# Patient Record
Sex: Female | Born: 1955 | Hispanic: No | Smoking: Former smoker
Health system: Southern US, Community
[De-identification: ages and names within clinical notes are randomized; demographics above are authoritative.]

---

## 1998-12-07 ENCOUNTER — Other Ambulatory Visit: Admission: RE | Admit: 1998-12-07 | Discharge: 1998-12-07 | Payer: Self-pay | Admitting: Obstetrics and Gynecology

## 2014-10-13 ENCOUNTER — Ambulatory Visit (INDEPENDENT_AMBULATORY_CARE_PROVIDER_SITE_OTHER): Payer: No Typology Code available for payment source | Admitting: Emergency Medicine

## 2014-10-13 VITALS — BP 120/80 | HR 72 | Temp 98.2°F | Resp 18 | Ht 62.0 in | Wt 148.4 lb

## 2014-10-13 DIAGNOSIS — R5383 Other fatigue: Secondary | ICD-10-CM

## 2014-10-13 DIAGNOSIS — R319 Hematuria, unspecified: Secondary | ICD-10-CM

## 2014-10-13 LAB — POCT UA - MICROSCOPIC ONLY
Bacteria, U Microscopic: NEGATIVE
CASTS, UR, LPF, POC: NEGATIVE
Crystals, Ur, HPF, POC: NEGATIVE
Mucus, UA: POSITIVE
WBC, Ur, HPF, POC: NEGATIVE
YEAST UA: NEGATIVE

## 2014-10-13 LAB — POCT URINALYSIS DIPSTICK
BILIRUBIN UA: NEGATIVE
Glucose, UA: NEGATIVE
KETONES UA: NEGATIVE
Leukocytes, UA: NEGATIVE
NITRITE UA: NEGATIVE
Protein, UA: NEGATIVE
Spec Grav, UA: 1.02
Urobilinogen, UA: 0.2
pH, UA: 5.5

## 2014-10-13 LAB — GLUCOSE, POCT (MANUAL RESULT ENTRY): POC Glucose: 7.7 mg/dl — AB (ref 70–99)

## 2014-10-13 NOTE — Patient Instructions (Signed)

## 2014-10-13 NOTE — Progress Notes (Signed)
Subjective:  Patient ID: Kristina Jacobson, female    DOB: June 24, 1955  Age: 59 y.o. MRN: [id number]  CC: Dizziness and Fatigue   HPI Kristina Jacobson presents  with a recent onset of fatigue. She said that over the last for 5 day she's had increasing fatigue. She has no cough cold right now sore throat fever chills nausea vomiting stool change no wheezing or shortness of breath. She is never had a colonoscopy hasn't been a doctor in at least 6 years has not had a mammogram or Pap smear. She  had no lab work in at least 6 years. She is working full time as a Veterinary surgeon working part-time in a [employer] and taking classes to get her bachelor's degree in [place]. She hopes to teach.  History Thea has no past medical history on file.   She has past surgical history that includes Cesarean section.   Her  family history includes Heart disease in her father; Hypertension in her brother and mother.  She   reports that she has quit smoking. She does not have any smokeless tobacco history on file. She reports that she does not drink alcohol or use illicit drugs.  No outpatient prescriptions prior to visit.   No facility-administered medications prior to visit.    History   Social History  . Marital Status: Unknown    Spouse Name: N/A  . Number of Children: N/A  . Years of Education: N/A   Social History Main Topics  . Smoking status: Former Games developer  . Smokeless tobacco: Not on file  . Alcohol Use: No  . Drug Use: No  . Sexual Activity: Not on file   Other Topics Concern  . None   Social History Narrative  . None     Review of Systems  Constitutional: Positive for fatigue. Negative for fever, chills and appetite change.  HENT: Negative for congestion, ear pain, postnasal drip, sinus pressure and sore throat.   Eyes: Negative for pain and redness.  Respiratory: Negative for cough, shortness of breath and wheezing.   Cardiovascular: Negative for leg swelling.    Gastrointestinal: Negative for nausea, vomiting, abdominal pain, diarrhea, constipation and blood in stool.  Endocrine: Negative for polyuria.  Genitourinary: Negative for dysuria, urgency, frequency and flank pain.  Musculoskeletal: Negative for gait problem.  Skin: Negative for rash.  Neurological: Negative for weakness and headaches.  Psychiatric/Behavioral: Negative for confusion and decreased concentration. The patient is not nervous/anxious.     Objective:  BP 120/80 mmHg  Pulse 72  Temp(Src) 98.2 F (36.8 C) (Oral)  Resp 18  Ht 5\' 2"  (1.575 m)  Wt 148 lb 6 oz (67.302 kg)  BMI 27.13 kg/m2  SpO2 98%  Physical Exam  Constitutional: She is oriented to person, place, and time. She appears well-developed and well-nourished. No distress.  HENT:  Head: Normocephalic and atraumatic.  Right Ear: External ear normal.  Left Ear: External ear normal.  Nose: Nose normal.  Eyes: Conjunctivae and EOM are normal. Pupils are equal, round, and reactive to light. No scleral icterus.  Neck: Normal range of motion. Neck supple. No tracheal deviation present.  Cardiovascular: Normal rate, regular rhythm and normal heart sounds.   Pulmonary/Chest: Effort normal. No respiratory distress. She has no wheezes. She has no rales.  Abdominal: She exhibits no mass. There is no tenderness. There is no rebound and no guarding.  Musculoskeletal: She exhibits no edema.  Lymphadenopathy:    She has no cervical adenopathy.  Neurological: She is alert and oriented to person, place, and time. Coordination normal.  Skin: Skin is warm and dry. No rash noted.  Psychiatric: She has a normal mood and affect. Her behavior is normal.      Assessment & Plan:   There are no diagnoses linked to this encounter. Ms. Knack does not currently have medications on file.  No orders of the defined types were placed in this encounter.   She was found to have hematuria on her urinalysis blood sugar is normal labs are  pending. She was referred for mammogram and colonoscopy. She'll follow-up in a month and will a revisit the hematuria and should that be persistent get her in to see her neurologist.   Appropriate red flag conditions were discussed with the patient as well as actions that should be taken.  Patient expressed his understanding.  Follow-up: No Follow-up on file.  Carmelina Dane, MD   Results for orders placed or performed in visit on 10/13/14  POCT UA - Microscopic Only  Result Value Ref Range   WBC, Ur, HPF, POC neg    RBC, urine, microscopic 0-2    Bacteria, U Microscopic neg    Mucus, UA positive    Epithelial cells, urine per micros 0-1    Crystals, Ur, HPF, POC neg    Casts, Ur, LPF, POC neg    Yeast, UA neg   POCT urinalysis dipstick  Result Value Ref Range   Color, UA yellow    Clarity, UA clear    Glucose, UA neg    Bilirubin, UA neg    Ketones, UA neg    Spec Grav, UA 1.020    Blood, UA moderate    pH, UA 5.5    Protein, UA neg    Urobilinogen, UA 0.2    Nitrite, UA neg    Leukocytes, UA Negative Negative  POCT glucose (manual entry)  Result Value Ref Range   POC Glucose 7.7 (A) 70 - 99 mg/dl

## 2014-10-14 LAB — COMPREHENSIVE METABOLIC PANEL
ALT: 13 U/L (ref 0–35)
AST: 17 U/L (ref 0–37)
Albumin: 4.2 g/dL (ref 3.5–5.2)
Alkaline Phosphatase: 64 U/L (ref 39–117)
BILIRUBIN TOTAL: 0.8 mg/dL (ref 0.2–1.2)
BUN: 21 mg/dL (ref 6–23)
CALCIUM: 9.4 mg/dL (ref 8.4–10.5)
CO2: 29 mEq/L (ref 19–32)
Chloride: 101 mEq/L (ref 96–112)
Creat: 1.07 mg/dL (ref 0.50–1.10)
Glucose, Bld: 83 mg/dL (ref 70–99)
Potassium: 3.9 mEq/L (ref 3.5–5.3)
SODIUM: 141 meq/L (ref 135–145)
Total Protein: 7.8 g/dL (ref 6.0–8.3)

## 2014-10-14 LAB — TSH: TSH: 2.746 u[IU]/mL (ref 0.350–4.500)

## 2014-10-14 LAB — CBC WITH DIFFERENTIAL/PLATELET
BASOS ABS: 0.1 10*3/uL (ref 0.0–0.1)
BASOS PCT: 1 % (ref 0–1)
Eosinophils Absolute: 0.2 10*3/uL (ref 0.0–0.7)
Eosinophils Relative: 3 % (ref 0–5)
HEMATOCRIT: 38.5 % (ref 36.0–46.0)
Hemoglobin: 13.3 g/dL (ref 12.0–15.0)
LYMPHS PCT: 40 % (ref 12–46)
Lymphs Abs: 2.5 10*3/uL (ref 0.7–4.0)
MCH: 28.1 pg (ref 26.0–34.0)
MCHC: 34.5 g/dL (ref 30.0–36.0)
MCV: 81.4 fL (ref 78.0–100.0)
MPV: 9.9 fL (ref 8.6–12.4)
Monocytes Absolute: 0.4 10*3/uL (ref 0.1–1.0)
Monocytes Relative: 7 % (ref 3–12)
NEUTROS ABS: 3 10*3/uL (ref 1.7–7.7)
NEUTROS PCT: 49 % (ref 43–77)
Platelets: 277 10*3/uL (ref 150–400)
RBC: 4.73 MIL/uL (ref 3.87–5.11)
RDW: 13.3 % (ref 11.5–15.5)
WBC: 6.2 10*3/uL (ref 4.0–10.5)

## 2014-10-20 ENCOUNTER — Other Ambulatory Visit: Payer: Self-pay

## 2014-10-20 DIAGNOSIS — Z1231 Encounter for screening mammogram for malignant neoplasm of breast: Secondary | ICD-10-CM

## 2015-06-06 ENCOUNTER — Ambulatory Visit (INDEPENDENT_AMBULATORY_CARE_PROVIDER_SITE_OTHER): Payer: BLUE CROSS/BLUE SHIELD | Admitting: Family Medicine

## 2015-06-06 VITALS — BP 143/84 | HR 76 | Temp 99.0°F | Resp 17 | Ht 69.29 in | Wt 152.0 lb

## 2015-06-06 DIAGNOSIS — Z23 Encounter for immunization: Secondary | ICD-10-CM | POA: Diagnosis not present

## 2015-06-06 NOTE — Patient Instructions (Signed)
You got your tetanus shot (tdap) today and an MMR immunity test.  I will be in touch with your lab results asap- if you want you can set up your mychart account and print these at home

## 2015-06-06 NOTE — Progress Notes (Addendum)
[facility] [address] [place] [address] [phone]  Date:  06/06/2015   Name:  Kristina Jacobson   DOB:  Aug 31, 1955   MRN:  [id number]  PCP:  No PCP Per Patient    Chief Complaint: Immunizations   History of Present Illness:  Kristina Jacobson is a 60 y.o. very pleasant female patient who presents with the following:  She needs some shots for school She needs a Tdap and proof of MMR immunity She is generally in good health NKDA She is an [place] major at [employer] and needs these immunizations to finish her degree  There are no active problems to display for this patient.   No past medical history on file.  Past Surgical History  Procedure Laterality Date  . Cesarean section      Social History  Substance Use Topics  . Smoking status: Former Research scientist (life sciences)  . Smokeless tobacco: None  . Alcohol Use: No    Family History  Problem Relation Age of Onset  . Hypertension Mother   . Heart disease Father   . Hypertension Brother     No Known Allergies  Medication list has been reviewed and updated.  No current outpatient prescriptions on file prior to visit.   No current facility-administered medications on file prior to visit.    Review of Systems:  As per HPI- otherwise negative.   Physical Examination: Filed Vitals:   06/06/15 1408  BP: 143/84  Pulse: 76  Temp: 99 F (37.2 C)  Resp: 17   Filed Vitals:   06/06/15 1408  Height: 5' 9.29" (1.76 m)  Weight: 152 lb (68.947 kg)   Body mass index is 22.26 kg/(m^2). Ideal Body Weight: Weight in (lb) to have BMI = 25: 170.4  GEN: WDWN, NAD, Non-toxic, A & O x 3 HEENT: Atraumatic, Normocephalic. Neck supple. No masses, No LAD. Ears and Nose: No external deformity. CV: RRR, No M/G/R. No JVD. No thrill. No extra heart sounds. PULM: CTA B, no wheezes, crackles, rhonchi. No retractions. No resp. distress. No accessory muscle use. ABD: S, NT, ND, +BS. No rebound. No HSM. EXTR:  No c/c/e NEURO Normal gait.  PSYCH: Normally interactive. Conversant. Not depressed or anxious appearing.  Calm demeanor.    Assessment and Plan: Immunization due - Plan: Tdap vaccine greater than or equal to 7yo IM, Measles/Mumps/Rubella Immunity  tdap and MMR titer today for school  Signed Lamar Blinks, MD  Received her titers 2/15-  Results for orders placed or performed in visit on 06/06/15  Measles/Mumps/Rubella Immunity  Result Value Ref Range   Rubella 5.10 (H) <0.90 Index   Mumps IgG 9.89 (H) <9.00 AU/mL   Rubeola IgG >300.00 (H) <25.00 AU/mL   Immune to rubella and rubeola but not to mumps. She will need to come in for an MMR.  I called but the number listed does not work and neither does the emergency contact number I will have to send her a letter- please give her an MMR when she comes in for follow-up

## 2015-06-07 LAB — MEASLES/MUMPS/RUBELLA IMMUNITY
MUMPS IGG: 9.89 [AU]/ml — AB (ref <9.00)
Rubella: 5.1 Index — ABNORMAL HIGH (ref <0.90)
Rubeola IgG: >300 AU/mL — ABNORMAL HIGH (ref <25.00)

## 2015-06-08 ENCOUNTER — Encounter: Payer: Self-pay | Admitting: Family Medicine

## 2015-06-20 ENCOUNTER — Ambulatory Visit (INDEPENDENT_AMBULATORY_CARE_PROVIDER_SITE_OTHER): Payer: BLUE CROSS/BLUE SHIELD | Admitting: *Deleted

## 2015-06-20 DIAGNOSIS — Z23 Encounter for immunization: Secondary | ICD-10-CM | POA: Diagnosis not present

## 2015-06-28 ENCOUNTER — Emergency Department (HOSPITAL_COMMUNITY)
Admission: EM | Admit: 2015-06-28 | Discharge: 2015-06-28 | Disposition: A | Discharge status: Home / Self Care | Payer: BLUE CROSS/BLUE SHIELD | Source: Home / Self Care | Attending: Family Medicine | Admitting: Family Medicine

## 2015-06-28 ENCOUNTER — Encounter (HOSPITAL_COMMUNITY): Payer: Self-pay | Admitting: *Deleted

## 2015-06-28 DIAGNOSIS — S39012A Strain of muscle, fascia and tendon of lower back, initial encounter: Secondary | ICD-10-CM | POA: Diagnosis not present

## 2015-06-28 MED ORDER — KETOROLAC TROMETHAMINE 30 MG/ML IJ SOLN
30.0000 mg | Freq: Once | INTRAMUSCULAR | Status: AC
  Administered 2015-06-28: 30 mg via INTRAMUSCULAR

## 2015-06-28 MED ORDER — KETOROLAC TROMETHAMINE 30 MG/ML IJ SOLN
INTRAMUSCULAR | Status: AC
  Filled 2015-06-28: qty 1

## 2015-06-28 MED ORDER — CYCLOBENZAPRINE HCL 5 MG PO TABS: 5.0000 mg | ORAL_TABLET | Freq: Three times a day (TID) | ORAL | Status: DC | PRN

## 2015-06-28 MED ORDER — IBUPROFEN 800 MG PO TABS: 800.0000 mg | ORAL_TABLET | Freq: Three times a day (TID) | ORAL | Status: DC

## 2015-06-28 NOTE — ED Provider Notes (Signed)
CSN: [id number]     Arrival date & time 06/28/15  1825 History   First MD Initiated Contact with Patient 06/28/15 2016     Chief Complaint  Patient presents with  . Back Pain   (Consider location/radiation/quality/duration/timing/severity/associated sxs/prior Treatment) Patient is a 60 y.o. female presenting with back pain. The history is provided by the patient.  Back Pain Location:  Gluteal region and sacro-iliac joint Quality:  Stabbing and stiffness Radiates to:  L posterior upper leg Pain severity:  Mild Onset quality:  Gradual Duration:  4 days Progression:  Unchanged Chronicity:  New Context: lifting heavy objects   Context comment:  Onset after picked up heavy case of water at home, no neuro ,gi, or gu sx. Relieved by:  None tried Worsened by:  Nothing tried Ineffective treatments:  None tried Associated symptoms: no abdominal pain, no bladder incontinence, no bowel incontinence, no chest pain, no dysuria, no fever, no leg pain, no numbness, no paresthesias, no pelvic pain, no perianal numbness and no weakness     History reviewed. No pertinent past medical history. Past Surgical History  Procedure Laterality Date  . Cesarean section     Family History  Problem Relation Age of Onset  . Hypertension Mother   . Heart disease Father   . Hypertension Brother    Social History  Substance Use Topics  . Smoking status: Former Games developer  . Smokeless tobacco: None  . Alcohol Use: No   OB History    No data available     Review of Systems  Constitutional: Negative.  Negative for fever.  Cardiovascular: Negative for chest pain.  Gastrointestinal: Negative.  Negative for abdominal pain and bowel incontinence.  Genitourinary: Negative.  Negative for bladder incontinence, dysuria and pelvic pain.  Musculoskeletal: Positive for back pain. Negative for myalgias, joint swelling and gait problem.  Neurological: Negative.  Negative for weakness, numbness and paresthesias.  All  other systems reviewed and are negative.   Allergies  Review of patient's allergies indicates no known allergies.  Home Medications   Prior to Admission medications   Medication Sig Start Date End Date Taking? Authorizing Provider  cyclobenzaprine (AMRIX) 30 MG 24 hr capsule Take 1 capsule (30 mg total) by mouth at bedtime. 07/04/15   Sherren Mocha, MD  meloxicam (MOBIC) 15 MG tablet Take 1 tablet (15 mg total) by mouth daily. Start after prednisone is complete 07/04/15   Sherren Mocha, MD  polyethylene glycol powder (GLYCOLAX/MIRALAX) powder Take 17 g by mouth daily. 07/04/15   Sherren Mocha, MD  predniSONE (DELTASONE) 20 MG tablet Take 3 tabs po qd x 2d, 2 tabs po qd x 2d, 1 tab po qd x 2d 07/04/15   Sherren Mocha, MD   Meds Ordered and Administered this Visit   Medications  ketorolac (TORADOL) 30 MG/ML injection 30 mg (30 mg Intramuscular Given 06/28/15 2053)    BP 130/70 mmHg  Pulse 78  Temp(Src) 98.6 F (37 C) (Oral)  Resp 18  SpO2 100% No data found.   Physical Exam  Constitutional: She is oriented to person, place, and time. She appears well-developed and well-nourished. No distress.  Musculoskeletal: She exhibits tenderness.       Lumbar back: She exhibits tenderness and pain. She exhibits no swelling, no laceration, no spasm and normal pulse.       Back:  Neurological: She is alert and oriented to person, place, and time.  Nursing note and vitals reviewed.   ED Course  Procedures (including critical care time)  Labs Review Labs Reviewed - No data to display  Imaging Review No results found.   Visual Acuity Review  Right Eye Distance:   Left Eye Distance:   Bilateral Distance:    Right Eye Near:   Left Eye Near:    Bilateral Near:         MDM   1. Low back strain, initial encounter        Linna HoffJames D Anay Walter, MD 07/08/15 2124

## 2015-06-28 NOTE — ED Notes (Signed)
Pt  Reports  l  Sided    Back  Pain        Worse  On  Movement   And  Position   Pt  Reports    Lifted   Object  On  Sunday  When  Pain  Started      Pt  denys  Any  Urinary  Symptoms

## 2015-06-28 NOTE — Discharge Instructions (Signed)
Heat to back and medicine and execises as given and see orthpedist if further problems.

## 2015-07-04 ENCOUNTER — Ambulatory Visit (INDEPENDENT_AMBULATORY_CARE_PROVIDER_SITE_OTHER): Payer: BLUE CROSS/BLUE SHIELD | Admitting: Family Medicine

## 2015-07-04 ENCOUNTER — Ambulatory Visit (INDEPENDENT_AMBULATORY_CARE_PROVIDER_SITE_OTHER): Payer: BLUE CROSS/BLUE SHIELD

## 2015-07-04 VITALS — BP 118/88 | HR 89 | Temp 97.7°F | Resp 16 | Ht 61.75 in | Wt 150.0 lb

## 2015-07-04 DIAGNOSIS — M5442 Lumbago with sciatica, left side: Secondary | ICD-10-CM

## 2015-07-04 MED ORDER — CYCLOBENZAPRINE HCL ER 30 MG PO CP24: 30.0000 mg | ORAL_CAPSULE | Freq: Every day | ORAL | Status: DC

## 2015-07-04 MED ORDER — PREDNISONE 20 MG PO TABS: ORAL_TABLET | ORAL | Status: DC

## 2015-07-04 MED ORDER — POLYETHYLENE GLYCOL 3350 17 GM/SCOOP PO POWD: 17.0000 g | Freq: Every day | ORAL | Status: DC

## 2015-07-04 MED ORDER — MELOXICAM 15 MG PO TABS: 15.0000 mg | ORAL_TABLET | Freq: Every day | ORAL | Status: DC

## 2015-07-04 NOTE — Patient Instructions (Addendum)
After the 6 day prednisone course is complete, then start the meloxicam daily. Do not use any other otc pain medication other than tylenol/acetaminophen - so no aleve, ibuprofen, motrin, advil, etc while you are on the meloxicam.  Sciatica With Rehab The sciatic nerve runs from the back down the leg and is responsible for sensation and control of the muscles in the back (posterior) side of the thigh, lower leg, and foot. Sciatica is a condition that is characterized by inflammation of this nerve.  SYMPTOMS   Signs of nerve damage, including numbness and/or weakness along the posterior side of the lower extremity.  Pain in the back of the thigh that may also travel down the leg.  Pain that worsens when sitting for long periods of time.  Occasionally, pain in the back or buttock. CAUSES  Inflammation of the sciatic nerve is the cause of sciatica. The inflammation is due to something irritating the nerve. Common sources of irritation include:  Sitting for long periods of time.  Direct trauma to the nerve.  Arthritis of the spine.  Herniated or ruptured disk.  Slipping of the vertebrae (spondylolisthesis).  Pressure from soft tissues, such as muscles or ligament-like tissue (fascia). RISK INCREASES WITH:  Sports that place pressure or stress on the spine (football or weightlifting).  Poor strength and flexibility.  Failure to warm up properly before activity.  Family history of low back pain or disk disorders.  Previous back injury or surgery.  Poor body mechanics, especially when lifting, or poor posture. PREVENTION   Warm up and stretch properly before activity.  Maintain physical fitness:  Strength, flexibility, and endurance.  Cardiovascular fitness.  Learn and use proper technique, especially with posture and lifting. When possible, have coach correct improper technique.  Avoid activities that place stress on the spine. PROGNOSIS If treated properly, then  sciatica usually resolves within 6 weeks. However, occasionally surgery is necessary.  RELATED COMPLICATIONS   Permanent nerve damage, including pain, numbness, tingle, or weakness.  Chronic back pain.  Risks of surgery: infection, bleeding, nerve damage, or damage to surrounding tissues. TREATMENT Treatment initially involves resting from any activities that aggravate your symptoms. The use of ice and medication may help reduce pain and inflammation. The use of strengthening and stretching exercises may help reduce pain with activity. These exercises may be performed at home or with referral to a therapist. A therapist may recommend further treatments, such as transcutaneous electronic nerve stimulation (TENS) or ultrasound. Your caregiver may recommend corticosteroid injections to help reduce inflammation of the sciatic nerve. If symptoms persist despite non-surgical (conservative) treatment, then surgery may be recommended. MEDICATION  If pain medication is necessary, then nonsteroidal anti-inflammatory medications, such as aspirin and ibuprofen, or other minor pain relievers, such as acetaminophen, are often recommended.  Do not take pain medication for 7 days before surgery.  Prescription pain relievers may be given if deemed necessary by your caregiver. Use only as directed and only as much as you need.  Ointments applied to the skin may be helpful.  Corticosteroid injections may be given by your caregiver. These injections should be reserved for the most serious cases, because they may only be given a certain number of times. HEAT AND COLD  Cold treatment (icing) relieves pain and reduces inflammation. Cold treatment should be applied for 10 to 15 minutes every 2 to 3 hours for inflammation and pain and immediately after any activity that aggravates your symptoms. Use ice packs or massage the area with a  piece of ice (ice massage).  Heat treatment may be used prior to performing the  stretching and strengthening activities prescribed by your caregiver, physical therapist, or athletic trainer. Use a heat pack or soak the injury in warm water. SEEK MEDICAL CARE IF:  Treatment seems to offer no benefit, or the condition worsens.  Any medications produce adverse side effects. EXERCISES  RANGE OF MOTION (ROM) AND STRETCHING EXERCISES - Sciatica Most people with sciatic will find that their symptoms worsen with either excessive bending forward (flexion) or arching at the low back (extension). The exercises which will help resolve your symptoms will focus on the opposite motion. Your physician, physical therapist or athletic trainer will help you determine which exercises will be most helpful to resolve your low back pain. Do not complete any exercises without first consulting with your clinician. Discontinue any exercises which worsen your symptoms until you speak to your clinician. If you have pain, numbness or tingling which travels down into your buttocks, leg or foot, the goal of the therapy is for these symptoms to move closer to your back and eventually resolve. Occasionally, these leg symptoms will get better, but your low back pain may worsen; this is typically an indication of progress in your rehabilitation. Be certain to be very alert to any changes in your symptoms and the activities in which you participated in the 24 hours prior to the change. Sharing this information with your clinician will allow him/her to most efficiently treat your condition. These exercises may help you when beginning to rehabilitate your injury. Your symptoms may resolve with or without further involvement from your physician, physical therapist or athletic trainer. While completing these exercises, remember:   Restoring tissue flexibility helps normal motion to return to the joints. This allows healthier, less painful movement and activity.  An effective stretch should be held for at least 30  seconds.  A stretch should never be painful. You should only feel a gentle lengthening or release in the stretched tissue. FLEXION RANGE OF MOTION AND STRETCHING EXERCISES: STRETCH - Flexion, Single Knee to Chest   Lie on a firm bed or floor with both legs extended in front of you.  Keeping one leg in contact with the floor, bring your opposite knee to your chest. Hold your leg in place by either grabbing behind your thigh or at your knee.  Pull until you feel a gentle stretch in your low back. Hold __________ seconds.  Slowly release your grasp and repeat the exercise with the opposite side. Repeat __________ times. Complete this exercise __________ times per day.  STRETCH - Flexion, Double Knee to Chest  Lie on a firm bed or floor with both legs extended in front of you.  Keeping one leg in contact with the floor, bring your opposite knee to your chest.  Tense your stomach muscles to support your back and then lift your other knee to your chest. Hold your legs in place by either grabbing behind your thighs or at your knees.  Pull both knees toward your chest until you feel a gentle stretch in your low back. Hold __________ seconds.  Tense your stomach muscles and slowly return one leg at a time to the floor. Repeat __________ times. Complete this exercise __________ times per day.  STRETCH - Low Trunk Rotation   Lie on a firm bed or floor. Keeping your legs in front of you, bend your knees so they are both pointed toward the ceiling and your feet are  flat on the floor.  Extend your arms out to the side. This will stabilize your upper body by keeping your shoulders in contact with the floor.  Gently and slowly drop both knees together to one side until you feel a gentle stretch in your low back. Hold for __________ seconds.  Tense your stomach muscles to support your low back as you bring your knees back to the starting position. Repeat the exercise to the other side. Repeat  __________ times. Complete this exercise __________ times per day  EXTENSION RANGE OF MOTION AND FLEXIBILITY EXERCISES: STRETCH - Extension, Prone on Elbows  Lie on your stomach on the floor, a bed will be too soft. Place your palms about shoulder width apart and at the height of your head.  Place your elbows under your shoulders. If this is too painful, stack pillows under your chest.  Allow your body to relax so that your hips drop lower and make contact more completely with the floor.  Hold this position for __________ seconds.  Slowly return to lying flat on the floor. Repeat __________ times. Complete this exercise __________ times per day.  RANGE OF MOTION - Extension, Prone Press Ups  Lie on your stomach on the floor, a bed will be too soft. Place your palms about shoulder width apart and at the height of your head.  Keeping your back as relaxed as possible, slowly straighten your elbows while keeping your hips on the floor. You may adjust the placement of your hands to maximize your comfort. As you gain motion, your hands will come more underneath your shoulders.  Hold this position __________ seconds.  Slowly return to lying flat on the floor. Repeat __________ times. Complete this exercise __________ times per day.  STRENGTHENING EXERCISES - Sciatica  These exercises may help you when beginning to rehabilitate your injury. These exercises should be done near your "sweet spot." This is the neutral, low-back arch, somewhere between fully rounded and fully arched, that is your least painful position. When performed in this safe range of motion, these exercises can be used for people who have either a flexion or extension based injury. These exercises may resolve your symptoms with or without further involvement from your physician, physical therapist or athletic trainer. While completing these exercises, remember:   Muscles can gain both the endurance and the strength needed for  everyday activities through controlled exercises.  Complete these exercises as instructed by your physician, physical therapist or athletic trainer. Progress with the resistance and repetition exercises only as your caregiver advises.  You may experience muscle soreness or fatigue, but the pain or discomfort you are trying to eliminate should never worsen during these exercises. If this pain does worsen, stop and make certain you are following the directions exactly. If the pain is still present after adjustments, discontinue the exercise until you can discuss the trouble with your clinician. STRENGTHENING - Deep Abdominals, Pelvic Tilt   Lie on a firm bed or floor. Keeping your legs in front of you, bend your knees so they are both pointed toward the ceiling and your feet are flat on the floor.  Tense your lower abdominal muscles to press your low back into the floor. This motion will rotate your pelvis so that your tail bone is scooping upwards rather than pointing at your feet or into the floor.  With a gentle tension and even breathing, hold this position for __________ seconds. Repeat __________ times. Complete this exercise __________ times per day.  STRENGTHENING - Abdominals, Crunches   Lie on a firm bed or floor. Keeping your legs in front of you, bend your knees so they are both pointed toward the ceiling and your feet are flat on the floor. Cross your arms over your chest.  Slightly tip your chin down without bending your neck.  Tense your abdominals and slowly lift your trunk high enough to just clear your shoulder blades. Lifting higher can put excessive stress on the low back and does not further strengthen your abdominal muscles.  Control your return to the starting position. Repeat __________ times. Complete this exercise __________ times per day.  STRENGTHENING - Quadruped, Opposite UE/LE Lift  Assume a hands and knees position on a firm surface. Keep your hands under your  shoulders and your knees under your hips. You may place padding under your knees for comfort.  Find your neutral spine and gently tense your abdominal muscles so that you can maintain this position. Your shoulders and hips should form a rectangle that is parallel with the floor and is not twisted.  Keeping your trunk steady, lift your right hand no higher than your shoulder and then your left leg no higher than your hip. Make sure you are not holding your breath. Hold this position __________ seconds.  Continuing to keep your abdominal muscles tense and your back steady, slowly return to your starting position. Repeat with the opposite arm and leg. Repeat __________ times. Complete this exercise __________ times per day.  STRENGTHENING - Abdominals and Quadriceps, Straight Leg Raise   Lie on a firm bed or floor with both legs extended in front of you.  Keeping one leg in contact with the floor, bend the other knee so that your foot can rest flat on the floor.  Find your neutral spine, and tense your abdominal muscles to maintain your spinal position throughout the exercise.  Slowly lift your straight leg off the floor about 6 inches for a count of 15, making sure to not hold your breath.  Still keeping your neutral spine, slowly lower your leg all the way to the floor. Repeat this exercise with each leg __________ times. Complete this exercise __________ times per day. POSTURE AND BODY MECHANICS CONSIDERATIONS - Sciatica Keeping correct posture when sitting, standing or completing your activities will reduce the stress put on different body tissues, allowing injured tissues a chance to heal and limiting painful experiences. The following are general guidelines for improved posture. Your physician or physical therapist will provide you with any instructions specific to your needs. While reading these guidelines, remember:  The exercises prescribed by your provider will help you have the  flexibility and strength to maintain correct postures.  The correct posture provides the optimal environment for your joints to work. All of your joints have less wear and tear when properly supported by a spine with good posture. This means you will experience a healthier, less painful body.  Correct posture must be practiced with all of your activities, especially prolonged sitting and standing. Correct posture is as important when doing repetitive low-stress activities (typing) as it is when doing a single heavy-load activity (lifting). RESTING POSITIONS Consider which positions are most painful for you when choosing a resting position. If you have pain with flexion-based activities (sitting, bending, stooping, squatting), choose a position that allows you to rest in a less flexed posture. You would want to avoid curling into a fetal position on your side. If your pain worsens with extension-based activities (  prolonged standing, working overhead), avoid resting in an extended position such as sleeping on your stomach. Most people will find more comfort when they rest with their spine in a more neutral position, neither too rounded nor too arched. Lying on a non-sagging bed on your side with a pillow between your knees, or on your back with a pillow under your knees will often provide some relief. Keep in mind, being in any one position for a prolonged period of time, no matter how correct your posture, can still lead to stiffness. PROPER SITTING POSTURE In order to minimize stress and discomfort on your spine, you must sit with correct posture Sitting with good posture should be effortless for a healthy body. Returning to good posture is a gradual process. Many people can work toward this most comfortably by using various supports until they have the flexibility and strength to maintain this posture on their own. When sitting with proper posture, your ears will fall over your shoulders and your shoulders  will fall over your hips. You should use the back of the chair to support your upper back. Your low back will be in a neutral position, just slightly arched. You may place a small pillow or folded towel at the base of your low back for support.  When working at a desk, create an environment that supports good, upright posture. Without extra support, muscles fatigue and lead to excessive strain on joints and other tissues. Keep these recommendations in mind: CHAIR:   A chair should be able to slide under your desk when your back makes contact with the back of the chair. This allows you to work closely.  The chair's height should allow your eyes to be level with the upper part of your monitor and your hands to be slightly lower than your elbows. BODY POSITION  Your feet should make contact with the floor. If this is not possible, use a foot rest.  Keep your ears over your shoulders. This will reduce stress on your neck and low back. INCORRECT SITTING POSTURES   If you are feeling tired and unable to assume a healthy sitting posture, do not slouch or slump. This puts excessive strain on your back tissues, causing more damage and pain. Healthier options include:  Using more support, like a lumbar pillow.  Switching tasks to something that requires you to be upright or walking.  Talking a brief walk.  Lying down to rest in a neutral-spine position. PROLONGED STANDING WHILE SLIGHTLY LEANING FORWARD  When completing a task that requires you to lean forward while standing in one place for a long time, place either foot up on a stationary 2-4 inch high object to help maintain the best posture. When both feet are on the ground, the low back tends to lose its slight inward curve. If this curve flattens (or becomes too large), then the back and your other joints will experience too much stress, fatigue more quickly and can cause pain.  CORRECT STANDING POSTURES Proper standing posture should be assumed  with all daily activities, even if they only take a few moments, like when brushing your teeth. As in sitting, your ears should fall over your shoulders and your shoulders should fall over your hips. You should keep a slight tension in your abdominal muscles to brace your spine. Your tailbone should point down to the ground, not behind your body, resulting in an over-extended swayback posture.  INCORRECT STANDING POSTURES  Common incorrect standing postures include a  forward head, locked knees and/or an excessive swayback. WALKING Walk with an upright posture. Your ears, shoulders and hips should all line-up. PROLONGED ACTIVITY IN A FLEXED POSITION When completing a task that requires you to bend forward at your waist or lean over a low surface, try to find a way to stabilize 3 of 4 of your limbs. You can place a hand or elbow on your thigh or rest a knee on the surface you are reaching across. This will provide you more stability so that your muscles do not fatigue as quickly. By keeping your knees relaxed, or slightly bent, you will also reduce stress across your low back. CORRECT LIFTING TECHNIQUES DO :   Assume a wide stance. This will provide you more stability and the opportunity to get as close as possible to the object which you are lifting.  Tense your abdominals to brace your spine; then bend at the knees and hips. Keeping your back locked in a neutral-spine position, lift using your leg muscles. Lift with your legs, keeping your back straight.  Test the weight of unknown objects before attempting to lift them.  Try to keep your elbows locked down at your sides in order get the best strength from your shoulders when carrying an object.  Always ask for help when lifting heavy or awkward objects. INCORRECT LIFTING TECHNIQUES DO NOT:   Lock your knees when lifting, even if it is a small object.  Bend and twist. Pivot at your feet or move your feet when needing to change  directions.  Assume that you cannot safely pick up a paperclip without proper posture.   This information is not intended to replace advice given to you by your health care provider. Make sure you discuss any questions you have with your health care provider.   Document Released: 04/09/2005 Document Revised: 08/24/2014 Document Reviewed: 07/22/2008 [employer] Interactive Patient Education [employer].

## 2015-07-04 NOTE — Progress Notes (Signed)
Subjective:  By signing my name below, I, [name], attest that this documentation has been prepared under the direction and in the presence of [name], MD.  Electronically Signed: Andrew Au, ED Scribe. 07/04/2015. 3:14 PM.   Patient ID: Kristina Jacobson, female    DOB: November 18, 1955, 60 y.o.   MRN: [id number]  HPI   Chief Complaint  Patient presents with  . Back Pain    low back pain running into left hip and thigh.  was seen at [facility] on 06/28/15   HPI Comments: Kristina Jacobson is a 60 y.o. female who [facility] complaining of left low back pain. She was seen at [facility] it appears she was prescribed flexeril and ibuprofen. No imaging was done. Pt first felt back pain after lifting case of water. She reports gradually worsening pain since injury. Pt rpeorts shooting pain down lateral thigh that stops at her knee first thing in the morning. Due to pain she has weakness in left leg in the morning that gets better throughout the day with movement. She has worsening pain going down stairs than climbing up. She has been taking ibuprofen and flexeril 3 times a day.  She was advised to use a heating pad to low back but finds this worsens pain. She denies hx of back pain. She denies changes in bladder and bowel.  There are no active problems to display for this patient.  No past medical history on file. Past Surgical History  Procedure Laterality Date  . Cesarean section     No Known Allergies Prior to Admission medications   Medication Sig Start Date End Date Taking? Authorizing Provider  cyclobenzaprine (FLEXERIL) 5 MG tablet Take 1 tablet (5 mg total) by mouth 3 (three) times daily as needed for muscle spasms. 06/28/15  Yes Linna Hoff, MD  ibuprofen (ADVIL,MOTRIN) 800 MG tablet Take 1 tablet (800 mg total) by mouth 3 (three) times daily. 06/28/15  Yes Linna Hoff, MD   Social History   Social History  . Marital Status: Unknown    Spouse  Name: N/A  . Number of Children: N/A  . Years of Education: N/A   Occupational History  . Not on file.   Social History Main Topics  . Smoking status: Former Games developer  . Smokeless tobacco: Not on file  . Alcohol Use: No  . Drug Use: No  . Sexual Activity: Not on file   Other Topics Concern  . Not on file   Social History Narrative   Review of Systems  Constitutional: Negative for fever and chills.  Gastrointestinal: Negative for nausea, vomiting, abdominal pain, diarrhea and constipation.  Genitourinary: Negative for urgency, frequency, decreased urine volume and difficulty urinating.  Musculoskeletal: Positive for myalgias, back pain, arthralgias and gait problem. Negative for joint swelling.  Skin: Negative for color change and rash.  Neurological: Positive for weakness. Negative for numbness.  Hematological: Negative for adenopathy. Does not bruise/bleed easily.    Objective:   Physical Exam  Constitutional: She is oriented to person, place, and time. She appears well-developed and well-nourished. No distress.  HENT:  Head: Normocephalic and atraumatic.  Eyes: Conjunctivae and EOM are normal.  Neck: Neck supple.  Cardiovascular: Normal rate.   Pulmonary/Chest: Effort normal.  Musculoskeletal: Normal range of motion.  No point tenderness over lumbar spine or para spinal muscles. Mild tenderness over left greater trochanter. Negative straight leg raise bilaterally. Normal hip ROM bilaterally.  Full lumbar ROM.   Neurological: She is alert and oriented to person, place, and time.  Reflex Scores:      Patellar reflexes are 2+ on the right side and 2+ on the left side.      Achilles reflexes are 2+ on the right side and 2+ on the left side. Skin: Skin is warm and dry.  Psychiatric: She has a normal mood and affect. Her behavior is normal.  Nursing note and vitals reviewed.  Filed Vitals:   07/04/15 1507  BP: 118/88  Pulse: 89  Temp: 97.7 F (36.5 C)  TempSrc: Oral    Resp: 16  Height: 5' 1.75" (1.568 m)  Weight: 150 lb (68.04 kg)  SpO2: 98%   Dg Lumbar Spine Complete  07/04/2015  CLINICAL DATA:  Low back pain.  Twisting injury. EXAM: LUMBAR SPINE - COMPLETE 4+ VIEW COMPARISON:  None. FINDINGS: Five lumbar type vertebral bodies. Sacroiliac joints are symmetric. Maintenance of vertebral body height and alignment. Intervertebral disc heights are maintained. IMPRESSION: No acute osseous abnormality. Electronically Signed   By: Jeronimo GreavesKyle  Talbot M.D.   On: 07/04/2015 16:47     Assessment & Plan:   1. Acute left-sided low back pain with left-sided sciatica     Orders Placed This Encounter  Procedures  . DG Lumbar Spine Complete    Standing Status: Future     Number of Occurrences: 1     Standing Expiration Date: 07/03/2016    Order Specific Question:  Reason for Exam (SYMPTOM  OR DIAGNOSIS REQUIRED)    Answer:  continued lumbar pain with intermittant left sciatica x 1 wk    Order Specific Question:  Is the patient pregnant?    Answer:  No    Order Specific Question:  Preferred imaging location?    Answer:  External  . Ambulatory referral to Physical Therapy    Referral Priority:  Routine    Referral Type:  Physical Medicine    Referral Reason:  Specialty Services Required    Requested Specialty:  Physical Therapy    Number of Visits Requested:  1    Meds ordered this encounter  Medications  . predniSONE (DELTASONE) 20 MG tablet    Sig: Take 3 tabs po qd x 2d, 2 tabs po qd x 2d, 1 tab po qd x 2d    Dispense:  12 tablet    Refill:  0  . cyclobenzaprine (AMRIX) 30 MG 24 hr capsule    Sig: Take 1 capsule (30 mg total) by mouth at bedtime.    Dispense:  30 capsule    Refill:  0  . meloxicam (MOBIC) 15 MG tablet    Sig: Take 1 tablet (15 mg total) by mouth daily. Start after prednisone is complete    Dispense:  30 tablet    Refill:  0  . polyethylene glycol powder (GLYCOLAX/MIRALAX) powder    Sig: Take 17 g by mouth daily.    Dispense:  250 g     Refill:  1    I personally performed the services described in this documentation, which was scribed in my presence. The recorded information has been reviewed and considered, and addended by me as needed.  Norberto SorensonEva Lional Icenogle, MD MPH

## 2015-07-13 ENCOUNTER — Ambulatory Visit: Payer: BLUE CROSS/BLUE SHIELD | Attending: Family Medicine | Admitting: Physical Therapy

## 2015-07-13 DIAGNOSIS — M5442 Lumbago with sciatica, left side: Secondary | ICD-10-CM | POA: Insufficient documentation

## 2015-07-13 NOTE — Therapy (Signed)
[facility] [address] [place], [place], [address] Phone: [phone]   Fax:  [phone]  Physical Therapy Evaluation  Patient Details  Name: Kristina Jacobson Date of Birth: 02/25/1956 Referring Provider: Dr. Zannie KehrEve Shaw  Encounter Date: 07/13/2015      PT End of Session - 07/13/15 1341    Visit Number 1   Number of Visits 4   Date for PT Re-Evaluation 08/10/15   PT Start Time 1300   PT Stop Time 1335   PT Time Calculation (min) 35 min   Activity Tolerance Patient tolerated treatment well   Behavior During Therapy [facility] for tasks assessed/performed      No past medical history on file.  Past Surgical History  Procedure Laterality Date  . Cesarean section      There were no vitals filed for this visit.  Visit Diagnosis:  Left-sided low back pain with left-sided sciatica      Subjective Assessment - 07/13/15 1304    Subjective Patient was lifting heavy object from truck and strained her low back on L side.  Pain was initially radiating down LLE.  She had LLE weakness, was difficult to stand, esp in the AM. She received a miuscle relaxer at Urgent Care.  She reports 80% improvement since the initial injury.  She is accustomed to working, being physically active and going to school.     Limitations Sitting;Lifting   Diagnostic tests XR   Patient Stated Goals Would like to be able to return to PLOF.    Currently in Pain? Yes   Pain Score 1   premedicated   Pain Location Back   Pain Orientation Left;Lower   Pain Descriptors / Indicators Burning   Pain Type Acute pain   Pain Radiating Towards LLE (initially, but has not done in 2 weeks)    Pain Onset More than a month ago   Pain Frequency Intermittent   Aggravating Factors  sitting too long, sudden movement   Pain Relieving Factors meds, stretches             [facility] PT Assessment - 07/13/15 1312    Assessment   Medical Diagnosis low back pain    Referring Provider Dr. Zannie KehrEve Shaw   Onset Date/Surgical Date 06/28/15   Hand Dominance Left   Next MD Visit None yet.    Prior Therapy No    Precautions   Precautions None   Restrictions   Weight Bearing Restrictions No   Balance Screen   Has the patient fallen in the past 6 months No   Prior Function   Level of Independence Independent   Cognition   Overall Cognitive Status Within Functional Limits for tasks assessed   Observation/Other Assessments   Focus on Therapeutic Outcomes (FOTO)  43%   Sensation   Light Touch Appears Intact   Coordination   Gross Motor Movements are Fluid and Coordinated Not tested   Posture/Postural Control   Posture/Postural Control Postural limitations   Posture Comments sway back, Rt. shoulder low    AROM   Lumbar Flexion WNL   Lumbar Extension WNL   Lumbar - Right Side Bend pain WNL   Lumbar - Left Side Bend WNL   Lumbar - Right Rotation WNL   Lumbar - Left Rotation pain WNL   Strength   Strength Assessment Site --  LE WNL in sitting    Palpation   Palpation comment min pain just lateral to SIJ some soreness into  lateral hip              PT Education - 07/13/15 1340    Education provided Yes   Education Details PT/POC, HEP and correction of posture    Person(s) Educated Patient   Methods Explanation;Demonstration;Handout   Comprehension Verbalized understanding;Returned demonstration;Need further instruction             PT Long Term Goals - 07/13/15 1409    PT LONG TERM GOAL #1   Title Patient will be I with HEP    Time 4   Period Weeks   Status New   PT LONG TERM GOAL #2   Title Pt will report resolution of pain by mid-day to 1/10 or less.    Time 4   Period Weeks   Status New   PT LONG TERM GOAL #3   Title Pt will be able to return to normal routine without increase in pain (fitness)   Time 4   Period Weeks   Status New   PT LONG TERM GOAL #4   Title FOTO score will improve to no more than 30% limited    Time 4    Period Weeks   Status New               Plan - 07/13/15 1343    Clinical Impression Statement Patient presents with low complexity eval of L sided low back pain which initially radiated into post lateral L leg.  No weakness evident today or sensory changes.  Only with some soreness in L lumbosacral spine.  Symptoms consistent with a disc or nerve root irritation, responds well to extension preference.  She is 80% better but could use education and supervision of a safe core exercise program.     Pt will benefit from skilled therapeutic intervention in order to improve on the following deficits Pain;Postural dysfunction;Decreased strength  core strength   Rehab Potential Excellent   PT Frequency 1x / week   PT Duration 3 weeks   PT Treatment/Interventions Therapeutic exercise;Neuromuscular re-education;Patient/family education;Moist Heat;Manual techniques   PT Next Visit Plan review and develop HEP for safe core (quadruped, planks, bridge?) and answer questions on posture, observe lifting, squat   PT Home Exercise Plan hamstring stretch, plank and posture info, ext for centralizing sx   Consulted and Agree with Plan of Care Patient         Problem List There are no active problems to display for this patient.   Humna Moorehouse 07/13/2015, 2:12 PM  [facility] [address] [place], [place], [address] Phone: [phone]   Fax:  [phone]  Name: Kristina Jacobson MRN: [id number] Date of Birth: Feb 22, 1956   Karie Mainland, PT 07/13/2015 2:12 PM Phone: [phone] Fax: [phone]

## 2015-07-13 NOTE — Patient Instructions (Signed)
Hamstring Stretch, Reclined (Strap, Doorframe)    Lengthen bottom leg on floor. Extend top leg along edge of doorframe or press foot up into yoga strap. Hold for _3___ breaths. Repeat __2__ times each leg.   Therapeutic - Prone Press-Up    Push up so chest is off floor and back is arched.Can also PROP up on elbows.  Do not let your leg pain increase.  Do not raise hips. Hold ____30  seconds. Return to prone position. Repeat __2-3__ times.  Copyright  VHI. All rights reserved.    Core exercises that are good for you:  Plank on elbows up to 30 sec.x 3-5 each    Reducing Load   Copyright  VHI. All rights reserved.  BODY MECHANICS Tips Good body mechanics are important during activities of daily living. The practice of good body mechanics will: -help distribute weight throughout the skeleton in a more anatomically correct manner thus stimulating more normal forces on the bones, and encouraging stronger, healthier, denser bones. -reduce unnatural forces on bones, ligaments, joints and muscles and reduce risk of fracture, other injury or back pain. A WORD ON BODY POSITIONING: Sitting is the hardest position for the back. Lying on the back is the easiest. Standing, in good body alignment, is somewhere between. A good motto is: Sit less, stand more, and, when you can't do that, lie down on your back and exercise to strengthen it.  Copyright  VHI. All rights reserved.       Supine to Sit (Active)   Lie on back, left leg bent. Roll to other side. From side-lying, sit up on side of bed. Complete ___ sets of ___ repetitions. Perform ___ sessions per day.  Copyright  VHI. All rights reserved.    Housework - Reaching Down   If you are unable to bend your knees or squat, use a lazy Darl PikesSusan to keep items within easy reach. Store only light, unbreakable items on the lowest shelves, and use a reacher to pick them up.  Copyright  VHI. All rights reserved.  Low Shelf   Squat  down, and bring item close to lift.   Copyright  VHI. All rights reserved.  Lifting Principles .Maintain proper posture and head alignment. .Slide object as close as possible before lifting. .Move obstacles out of the way. .Test before lifting; ask for help if too heavy. .Tighten stomach muscles without holding breath. .Use smooth movements; do not jerk. .Use legs to do the work, and pivot with feet. .Distribute the work load symmetrically and close to the center of trunk. .Push instead of pull whenever possible.  Copyright  VHI. All rights reserved.  Posture - Standing   Good posture is important. Avoid slouching and forward head thrust. Maintain curve in low back and align ears over shoul- ders, hips over ankles.   Copyright  VHI. All rights reserved.   Posture - Sitting   Sit upright, head facing forward. Try using a roll to support lower back. Keep shoulders relaxed, and avoid rounded back. Keep hips level with knees. Avoid crossing legs for long periods.   Copyright  VHI. All rights reserved.  Ideal Posture Use with figures on 3 (2 of 2): 1.Head erect 2.Chin in 3.Chest and navel aligned 4.Spinal curves maintained 5.Knees relaxed 6.Shoulders and hips aligned 7.Feet slightly apart 8.Toes and arches active 9.Abdomen taut (breathe with diaphragm) 10.Arms at sides Ideal posture is: -pain free. -achieved with practice, mindful interest, and body awareness.  Copyright  VHI. All rights reserved.  Move heavy items one at a time, or move portions of the contents.   Posture Awareness     Stand and check posture: Jut chin, pull back to comfortable position. Tilt pelvis forward, back; be sure back is not swayed. Roll from heels to balls of feet, then distribute your weight evenly. Picture a line through spine pulling you erect. Focus on breathing. Good Posture = Better Breathing. Check ____ times per day.  http://gt2.exer.us/873   Copyright  VHI. All rights  reserved.

## 2015-07-21 ENCOUNTER — Ambulatory Visit: Payer: BLUE CROSS/BLUE SHIELD | Admitting: Physical Therapy

## 2015-07-21 DIAGNOSIS — M5442 Lumbago with sciatica, left side: Secondary | ICD-10-CM | POA: Diagnosis not present

## 2015-07-21 NOTE — Patient Instructions (Signed)
Trunk: Rotation    Lie on back on firm, flat surface with knees bent. Keep head and shoulders flat, slowly lower knees to floor. May also do with legs straight. Lift one at a time up and across to touch floor. Hold _30__ seconds. Repeat __6__ times, alternating sides. Do _2___ sessions per day. CAUTION: Movement should be gentle, steady and slow.  Copyright  VHI. All rights reserved.

## 2015-07-21 NOTE — Therapy (Signed)
[facility] [address] [place], [place], [address] Phone: [phone]   Fax:  [phone]  Physical Therapy Treatment  Patient Details  Name: Kristina Jacobson MRN: [id number] Date of Birth: 1955/07/16 Referring Provider: Dr. Zannie Kehr  Encounter Date: 07/21/2015      PT End of Session - 07/21/15 1335    Visit Number 2   Number of Visits 4   Date for PT Re-Evaluation 08/10/15   PT Start Time 0130   PT Stop Time 0230   PT Time Calculation (min) 60 min      No past medical history on file.  Past Surgical History  Procedure Laterality Date  . Cesarean section      There were no vitals filed for this visit.  Visit Diagnosis:  Left-sided low back pain with left-sided sciatica      Subjective Assessment - 07/21/15 1334    Subjective The pain is not constant. I feel it  more in the evenings at my second job standing.    Currently in Pain? No/denies   Aggravating Factors  standing in one place   Pain Relieving Factors meds, stretches                         [facility] Adult PT Treatment/Exercise - 07/21/15 0001    Self-Care   Self-Care ADL's   ADL's Discussed Body Mechanics handouts and practiced lifting with hip hinge   Exercises   Exercises Lumbar   Lumbar Exercises: Stretches   Lower Trunk Rotation 30 seconds;3 reps   Lower Trunk Rotation Limitations book openings with top leg extended for lateral hip stretch   Prone on Elbows Stretch 60 seconds   Lumbar Exercises: Prone   Other Prone Lumbar Exercises Plank on elbow 2 x 30 sec   Lumbar Exercises: Quadruped   Single Arm Raise 10 reps   Straight Leg Raise 10 reps   Opposite Arm/Leg Raise 10 reps   Modalities   Modalities Moist Heat   Moist Heat Therapy   Number Minutes Moist Heat 15 Minutes   Moist Heat Location Hip  lateral                PT Education - 07/21/15 1414    Education provided Yes   Education Details book openings with  hip flexion   Person(s) Educated Patient   Methods Explanation   Comprehension Verbalized understanding             PT Long Term Goals - 07/21/15 1623    PT LONG TERM GOAL #1   Title Patient will be I with HEP    Time 4   Period Weeks   Status Achieved   PT LONG TERM GOAL #2   Title Pt will report resolution of pain by mid-day to 1/10 or less.    Time 4   Period Weeks   Status On-going   PT LONG TERM GOAL #3   Title Pt will be able to return to normal routine without increase in pain (fitness)   Time 4   Period Weeks   Status On-going   PT LONG TERM GOAL #4   Title FOTO score will improve to no more than 30% limited    Time 4   Period Weeks   Status On-going               Plan - 07/21/15 1423    Clinical Impression Statement Pt reports she is  better. 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 Instructed pt in proper body mechanics with lifting, standing, and ending. Recommended step stool for foot prop while standing and also while washing client's hair. Review of HEP, pt is independent. Instructed pt in lateral hip stretches and updated HEP. Also began quadruped alt UE/LE raises with cues for neutral and Tra contraction. No increased pain post treatment. HMP used for lateral hip tenderness.    PT Next Visit Plan check pain, tenderness at lateral hip, try modalities if still painful, US? review new stretch        Problem List There are no active problems to display for this patient.   Sherrie MustacheDonoho, Ziasia Lenoir McGee , [place]  07/21/2015, 4:30 PM  [facility] [address] [place], [place], [address] Phone: [phone]   Fax:  [phone]  Name: Lynda RainwaterDenise E Haymond MRN: [id number] Date of Birth: 08-15-1955

## 2015-07-28 ENCOUNTER — Ambulatory Visit: Payer: BLUE CROSS/BLUE SHIELD | Attending: Family Medicine | Admitting: Physical Therapy

## 2015-07-28 DIAGNOSIS — M5442 Lumbago with sciatica, left side: Secondary | ICD-10-CM | POA: Diagnosis not present

## 2015-07-28 NOTE — Therapy (Addendum)
[place], [place], [address] Phone: [phone]   Fax:  [phone]  Physical Therapy Treatment/DISCHARGE  Patient Details  Name: Kristina Jacobson MRN: [id number] Date of Birth: 12-Jun-1955 Referring Provider: Dr. Marijean Heath  Encounter Date: 07/28/2015      PT End of Session - 07/28/15 1353    Visit Number 3   Number of Visits 4   Date for PT Re-Evaluation 08/10/15   PT Start Time 0133   PT Stop Time 0230   PT Time Calculation (min) 57 min      No past medical history on file.  Past Surgical History  Procedure Laterality Date  . Cesarean section      There were no vitals filed for this visit.  Visit Diagnosis:  Left-sided low back pain with left-sided sciatica      Subjective Assessment - 07/28/15 1336    Subjective I am much better. 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 No pain now, up to a 4/10 at the salon.    Currently in Pain? No/denies   Aggravating Factors  standing in one place   Pain Relieving Factors good body mechanics            [facility] PT Assessment - 07/28/15 0001    AROM   Lumbar Flexion WNL   Lumbar Extension WNL   Lumbar - Right Side Bend  WNL   Lumbar - Left Side Bend WNL   Lumbar - Right Rotation WNL   Lumbar - Left Rotation      WNL   Strength   Strength Assessment Site Hip   Right/Left Hip Right;Left   Right Hip Extension 4+/5   Right Hip ABduction 4+/5   Left Hip Extension 4+/5   Left Hip ABduction 4+/5                     [facility] Adult PT Treatment/Exercise - 07/28/15 0001    Lumbar Exercises: Stretches   Lower Trunk Rotation 30 seconds;3 reps   Lower Trunk Rotation Limitations book openings with top leg extended for lateral hip stretch   Lumbar Exercises: Supine   Bridge 10 reps   Bridge Limitations bridge with clam x 5-can feel a little pain left side, bridge with ball squeeze x 5                       PT Long Term Goals - 07/28/15 1635    PT LONG TERM GOAL #1   Title Patient will be I with HEP    Time 4   Status On-going   PT LONG TERM GOAL #2   Title Pt will report resolution of pain by mid-day to 1/10 or less.    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 Used massage roller to decrease pain at lateral/ proximal hip/ Trial of IFC to this area to decrease pain. Began bridge stabilization with clams with min pain at first at leateral hip, better after manual.    Pt will benefit from skilled therapeutic intervention in order to improve on the following deficits --  Left sided low back pain with left sided sciatica   PT Next Visit Plan check pain, tenderness at lateral hip, try modalities if still painful, [place]? ionto? give quadruped and bridge with clams for hEP        Problem List There are no active problems to display for this patient.   Dorene Ar , [place]  07/28/2015, 4:38 PM  [place], [place], [address] Phone: [phone]   Fax:  [phone]  Name: HOLLIN CREWE MRN: [id number] Date of Birth: [birth year]  PHYSICAL THERAPY DISCHARGE SUMMARY  Visits from Start of Care: 3  Current functional level related to goals / functional outcomes: Unknown, see above    Remaining deficits: Unknown, See above    Education / Equipment: Body mechanics, HEP  Plan: Patient agrees  to discharge.  Patient goals were partially met. Patient is being discharged due to not returning since the last visit.  ?????     Raeford Razor, PT 02/10/16 9:50 AM Phone: [phone] Fax: [phone]

## 2015-08-03 ENCOUNTER — Encounter: Payer: Self-pay | Admitting: Physical Therapy

## 2017-02-19 ENCOUNTER — Ambulatory Visit (INDEPENDENT_AMBULATORY_CARE_PROVIDER_SITE_OTHER): Payer: BLUE CROSS/BLUE SHIELD

## 2017-02-19 ENCOUNTER — Encounter: Payer: Self-pay | Admitting: Family Medicine

## 2017-02-19 ENCOUNTER — Ambulatory Visit (INDEPENDENT_AMBULATORY_CARE_PROVIDER_SITE_OTHER): Payer: BLUE CROSS/BLUE SHIELD | Admitting: Family Medicine

## 2017-02-19 VITALS — BP 118/80 | HR 78 | Temp 98.3°F | Resp 16 | Ht 61.75 in | Wt 164.4 lb

## 2017-02-19 DIAGNOSIS — M25511 Pain in right shoulder: Secondary | ICD-10-CM

## 2017-02-19 DIAGNOSIS — S46001A Unspecified injury of muscle(s) and tendon(s) of the rotator cuff of right shoulder, initial encounter: Secondary | ICD-10-CM

## 2017-02-19 DIAGNOSIS — M7541 Impingement syndrome of right shoulder: Secondary | ICD-10-CM | POA: Diagnosis not present

## 2017-02-19 MED ORDER — MELOXICAM 15 MG PO TABS: 15.0000 mg | ORAL_TABLET | Freq: Every day | ORAL | 1 refills | Status: DC

## 2017-02-19 NOTE — Patient Instructions (Addendum)
Ice the shoulder and deltoid twice a day.  Take the meloxicam every night before bed.  Start with exercise A only and then will start physical therapy.    IF you received an x-ray today, you will receive an invoice from [facility]. Please contact [facility] at [phone] with questions or concerns regarding your invoice.   IF you received labwork today, you will receive an invoice from [facility]. Please contact [facility] at [phone] with questions or concerns regarding your invoice.   Our billing staff will not be able to assist you with questions regarding bills from these companies.  You will be contacted with the lab results as soon as they are available. The fastest way to get your results is to activate your My Chart account. Instructions are located on the last page of this paperwork. If you have not heard from us regarding the results in 2 weeks, please contact this office.      Shoulder Impingement Syndrome Shoulder impingement syndrome is a condition that causes pain when connective tissues (tendons) surrounding the shoulder joint become pinched. These tendons are part of the group of muscles and tissues that help to stabilize the shoulder (rotator cuff). Beneath the rotator cuff is a fluid-filled sac (bursa) that allows the muscles and tendons to glide smoothly. The bursa may become swollen or irritated (bursitis). Bursitis, swelling in the rotator cuff tendons, or both conditions can decrease how much space is under a bone in the shoulder joint (acromion), resulting in impingement. What are the causes? Shoulder impingement syndrome can be caused by bursitis or swelling of the rotator cuff tendons, which may result from:  Repetitive overhead arm movements.  Falling onto the shoulder.  Weakness in the shoulder muscles.  What increases the risk? You may be more likely to develop this condition if you are an athlete who participates in:  Sports that  involve throwing, such as baseball.  Tennis.  Swimming.  Volleyball.  Some people are also more likely to develop impingement syndrome because of the shape of their acromion bone. What are the signs or symptoms? The main symptom of this condition is pain on the front or side of the shoulder. Pain may:  Get worse when lifting or raising the arm.  Get worse at night.  Wake you up from sleeping.  Feel sharp when the shoulder is moved, and then fade to an ache.  Other signs and symptoms may include:  Tenderness.  Stiffness.  Inability to raise the arm above shoulder level or behind the body.  Weakness.  How is this diagnosed? This condition may be diagnosed based on:  Your symptoms.  Your medical history.  A physical exam.  Imaging tests, such as: ? X-rays. ? MRI. ? Ultrasound.  How is this treated? Treatment for this condition may include:  Resting your shoulder and avoiding all activities that cause pain or put stress on the shoulder.  Icing your shoulder.  NSAIDs to help reduce pain and swelling.  One or more injections of medicines to numb the area and reduce inflammation.  Physical therapy.  Surgery. This may be needed if nonsurgical treatments have not helped. Surgery may involve repairing the rotator cuff, reshaping the acromion, or removing the bursa.  Follow these instructions at home: Managing pain, stiffness, and swelling  If directed, apply ice to the injured area. ? Put ice in a plastic bag. ? Place a towel between your skin and the bag. ? Leave the ice on for 20 minutes, 2-3  times a day. Activity  Rest and return to your normal activities as told by your health care provider. Ask your health care provider what activities are safe for you.  Do exercises as told by your health care provider. General instructions  Do not use any tobacco products, including cigarettes, chewing tobacco, or e-cigarettes. Tobacco can delay healing. If you  need help quitting, ask your health care provider.  Ask your health care provider when it is safe for you to drive.  Take over-the-counter and prescription medicines only as told by your health care provider.  Keep all follow-up visits as told by your health care provider. This is important. How is this prevented?  Give your body time to rest between periods of activity.  Be safe and responsible while being active to avoid falls.  Maintain physical fitness, including strength and flexibility. Contact a health care provider if:  Your symptoms have not improved after 1-2 months of treatment and rest.  You cannot lift your arm away from your body. This information is not intended to replace advice given to you by your health care provider. Make sure you discuss any questions you have with your health care provider. Document Released: 04/09/2005 Document Revised: 12/15/2015 Document Reviewed: 03/12/2015 Elsevier Interactive Patient Education  2018 [employer]  Shoulder Impingement Syndrome Rehab Ask your health care provider which exercises are safe for you. Do exercises exactly as told by your health care provider and adjust them as directed. It is normal to feel mild stretching, pulling, tightness, or discomfort as you do these exercises, but you should stop right away if you feel sudden pain or your pain gets worse.Do not begin these exercises until told by your health care provider. Stretching and range of motion exercise This exercise warms up your muscles and joints and improves the movement and flexibility of your shoulder. This exercise also helps to relieve pain and stiffness. Exercise A: Passive horizontal adduction  1. Sit or stand and pull your left / right elbow across your chest, toward your other shoulder. Stop when you feel a gentle stretch in the back of your shoulder and upper arm. ? Keep your arm at shoulder height. ? Keep your arm as close to your body as you  comfortably can. 2. Hold for __________ seconds. 3. Slowly return to the starting position. Repeat __________ times. Complete this exercise __________ times a day. Strengthening exercises These exercises build strength and endurance in your shoulder. Endurance is the ability to use your muscles for a long time, even after they get tired. Exercise B: External rotation, isometric 1. Stand or sit in a doorway, facing the door frame. 2. Bend your left / right elbow and place the back of your wrist against the door frame. Only your wrist should be touching the frame. Keep your upper arm at your side. 3. Gently press your wrist against the door frame, as if you are trying to push your arm away from your abdomen. ? Avoid shrugging your shoulder while you press your hand against the door frame. Keep your shoulder blade tucked down toward the middle of your back. 4. Hold for __________ seconds. 5. Slowly release the tension, and relax your muscles completely before you do the exercise again. Repeat __________ times. Complete this exercise __________ times a day. Exercise C: Internal rotation, isometric  1. Stand or sit in a doorway, facing the door frame. 2. Bend your left / right elbow and place the inside of your wrist against the  door frame. Only your wrist should be touching the frame. Keep your upper arm at your side. 3. Gently press your wrist against the door frame, as if you are trying to push your arm toward your abdomen. ? Avoid shrugging your shoulder while you press your hand against the door frame. Keep your shoulder blade tucked down toward the middle of your back. 4. Hold for __________ seconds. 5. Slowly release the tension, and relax your muscles completely before you do the exercise again. Repeat __________ times. Complete this exercise __________ times a day. Exercise D: Scapular protraction, supine  1. Lie on your back on a firm surface. Hold a __________ weight in your left /  right hand. 2. Raise your left / right arm straight into the air so your hand is directly above your shoulder joint. 3. Push the weight into the air so your shoulder lifts off of the surface that you are lying on. Do not move your head, neck, or back. 4. Hold for __________ seconds. 5. Slowly return to the starting position. Let your muscles relax completely before you repeat this exercise. Repeat __________ times. Complete this exercise __________ times a day. Exercise E: Scapular retraction  1. Sit in a stable chair without armrests, or stand. 2. Secure an exercise band to a stable object in front of you so the band is at shoulder height. 3. Hold one end of the exercise band in each hand. Your palms should face down. 4. Squeeze your shoulder blades together and move your elbows slightly behind you. Do not shrug your shoulders while you do this. 5. Hold for __________ seconds. 6. Slowly return to the starting position. Repeat __________ times. Complete this exercise __________ times a day. Exercise F: Shoulder extension  1. Sit in a stable chair without armrests, or stand. 2. Secure an exercise band to a stable object in front of you where the band is above shoulder height. 3. Hold one end of the exercise band in each hand. 4. Straighten your elbows and lift your hands up to shoulder height. 5. Squeeze your shoulder blades together and pull your hands down to the sides of your thighs. Stop when your hands are straight down by your sides. Do not let your hands go behind your body. 6. Hold for __________ seconds. 7. Slowly return to the starting position. Repeat __________ times. Complete this exercise __________ times a day. This information is not intended to replace advice given to you by your health care provider. Make sure you discuss any questions you have with your health care provider. Document Released: 04/09/2005 Document Revised: 12/15/2015 Document Reviewed: 03/12/2015 Elsevier  Interactive Patient Education  [employer].

## 2017-02-19 NOTE — Progress Notes (Signed)
Subjective:    Patient ID: Kristina Jacobson, female    DOB: Aug 22, 1955, 61 y.o.   MRN: [id number] Chief Complaint  Patient presents with  . Arm Pain    feels may have hurt right arm a month ago possible when she was working out     HPI  Kristina Jacobson is delightful 61 yo left-hand dominant female who reports a bout a month prior she was on the elliptical and doing planks but her right arm has started hurting, more at night. Over her lateral shoulder and deltoid area and occasionally radiates down into her right hand.  Normally she sleeps on her side so has to sleep on her back. No h/o neck injuries or pain. No prior shoulder injuries.  She has tried myoflex rub and biofreeze but made it worse and has tried some aleve but prefers not to take meds unless necessary. She is having more difficulty with her right hand grip and has had to stop exercising completely.   History reviewed. No pertinent past medical history. Past Surgical History:  Procedure Laterality Date  . CESAREAN SECTION     No current outpatient prescriptions on file prior to visit.   No current facility-administered medications on file prior to visit.    No Known Allergies Family History  Problem Relation Age of Onset  . Hypertension Mother   . Heart disease Father   . Hypertension Brother    Social History   Social History  . Marital status: Unknown    Spouse name: N/A  . Number of children: N/A  . Years of education: N/A   Social History Main Topics  . Smoking status: Former Games developermoker  . Smokeless tobacco: Never Used  . Alcohol use No  . Drug use: No  . Sexual activity: Not Asked   Other Topics Concern  . None   Social History Narrative  . None   Depression screen [facility] 2[facility]9 02/19/2017 07/04/2015 06/06/2015 10/13/2014  Decreased Interest 0 0 0 0  Down, Depressed, Hopeless 0 0 0 0  PHQ - 2 Score 0 0 0 0     Review of Systems  Constitutional: Positive for activity change. Negative for chills, fever and unexpected  weight change.  Musculoskeletal: Positive for arthralgias, gait problem and myalgias. Negative for back pain and joint swelling.  Skin: Negative for color change and rash.  Neurological: Negative for weakness and numbness.       Objective:   Physical Exam  Constitutional: She is oriented to person, place, and time. She appears well-developed and well-nourished. No distress.  HENT:  Head: Normocephalic and atraumatic.  Right Ear: External ear normal.  Eyes: Conjunctivae are normal. No scleral icterus.  Pulmonary/Chest: Effort normal.  Musculoskeletal:       Right shoulder: She exhibits pain and decreased strength. She exhibits normal range of motion, no tenderness, no bony tenderness and no spasm.       Cervical back: She exhibits no tenderness, no bony tenderness, no pain and no spasm.  Is holding right shoulder about 1-2 cm lower than left and seems to have neck shifted to right slighty at rest.No tenderness of c-spine or cervical paraspinal. Neg spurlings. Pain with full internal rotation of right shoulder.  Strength 4++/5 on Rt deltoid, bicep, tricep due to pain. 5/5 on grip, opposition, wrist flex/ext and on left. 4/5 with sig pain on right empty can test and resisted internal and external rotation.  + Neer's and Hawkin's  Neurological: She is alert and oriented  to person, place, and time.  Reflex Scores:      Tricep reflexes are 2+ on the right side and 2+ on the left side.      Bicep reflexes are 2+ on the right side and 2+ on the left side.      Brachioradialis reflexes are 2+ on the right side and 2+ on the left side. Skin: Skin is warm and dry. She is not diaphoretic. No erythema.  Psychiatric: She has a normal mood and affect. Her behavior is normal.    BP 118/80   Pulse 78   Temp 98.3 F (36.8 C)   Resp 16   Ht 5' 1.75" (1.568 m)   Wt 164 lb 6.4 oz (74.6 kg)   SpO2 99%   BMI 30.31 kg/m     Dg Shoulder Right  Result Date: 02/19/2017 CLINICAL DATA:  Lateral right  shoulder pain for 1 month. No known injury. EXAM: RIGHT SHOULDER - 2+ VIEW COMPARISON:  None. FINDINGS: There is no evidence of fracture or dislocation. There is no evidence of arthropathy or other focal bone abnormality. Soft tissues are unremarkable. IMPRESSION: Negative exam. Electronically Signed   By: Drusilla Kanner M.D.   On: 02/19/2017 10:29    Assessment & Plan:   1. Pain in joint of right shoulder   2. Injury of tendon of right rotator cuff, initial encounter   3. Impingement syndrome of right shoulder region     Orders Placed This Encounter  Procedures  . DG Shoulder Right    Standing Status:   Future    Number of Occurrences:   1    Standing Expiration Date:   02/19/2018    Order Specific Question:   Reason for Exam (SYMPTOM  OR DIAGNOSIS REQUIRED)    Answer:   lateral pain radiating down deltoid x 1 mo, nki, + rotator cuff weakness on exam    Order Specific Question:   Preferred imaging location?    Answer:   External  . Ambulatory referral to Physical Therapy    Referral Priority:   Routine    Referral Type:   Physical Medicine    Referral Reason:   Specialty Services Required    Requested Specialty:   Physical Therapy    Number of Visits Requested:   1    Meds ordered this encounter  Medications  . meloxicam (MOBIC) 15 MG tablet    Sig: Take 1 tablet (15 mg total) by mouth daily.    Dispense:  30 tablet    Refill:  1     [name], M.D.  Primary Care at [facility] [address] [place], [place] [address] [phone] phone [phone] fax  02/22/17 12:02 AM

## 2017-10-07 ENCOUNTER — Ambulatory Visit: Payer: BLUE CROSS/BLUE SHIELD | Admitting: Family Medicine

## 2017-10-07 ENCOUNTER — Other Ambulatory Visit: Payer: Self-pay

## 2017-10-07 ENCOUNTER — Encounter: Payer: Self-pay | Admitting: Family Medicine

## 2017-10-07 VITALS — BP 126/84 | HR 64 | Temp 98.3°F | Resp 16 | Ht 62.0 in | Wt 162.2 lb

## 2017-10-07 DIAGNOSIS — R21 Rash and other nonspecific skin eruption: Secondary | ICD-10-CM | POA: Diagnosis not present

## 2017-10-07 MED ORDER — RANITIDINE HCL 150 MG PO TABS: 150.0000 mg | ORAL_TABLET | Freq: Two times a day (BID) | ORAL | 0 refills | Status: DC

## 2017-10-07 MED ORDER — CETIRIZINE HCL 10 MG PO TABS: 10.0000 mg | ORAL_TABLET | Freq: Every day | ORAL | 1 refills | Status: DC

## 2017-10-07 MED ORDER — TRIAMCINOLONE ACETONIDE 0.1 % EX CREA: 1.0000 "application " | TOPICAL_CREAM | Freq: Three times a day (TID) | CUTANEOUS | 0 refills | Status: DC

## 2017-10-07 NOTE — Progress Notes (Signed)
Subjective:  By signing my name below, I, Essence Howell, attest that this documentation has been prepared under the direction and in the presence of [name], MD Electronically Signed: Charline Bills, ED Scribe 10/07/2017 at 12:23 PM.   Patient ID: Kristina Jacobson, female    DOB: April 17, 1956, 62 y.o.   MRN: [id number]  Chief Complaint  Patient presents with  . Rash    itchy x 3-4 days, arms " I've got one on my chest"    HPI Kristina Jacobson is a 62 y.o. female who presents to Primary Care at [facility] complaining of a pruritic rash that began on the L forearm and L shoulder last wk, spread to the other hand and L breast x 3-4 days ago. She states that rash begins as a small red, itchy bump. She applied neosporin and cortisone cream to the areas which she states seems to dry up the areas and improve itching. Pt is not currently taking any meds. She does report that she changed mattresses ~1 month ago. Denies recent traveling, new clothes or bedding, visitors to her home, pet exposure, new foods/foods in large volumes. No known allergies.  No past medical history on file.   Current Outpatient Medications on File Prior to Visit  Medication Sig Dispense Refill  . meloxicam (MOBIC) 15 MG tablet Take 1 tablet (15 mg total) by mouth daily. (Patient not taking: Reported on 10/07/2017) 30 tablet 1   No current facility-administered medications on file prior to visit.    No Known Allergies   Review of Systems  Skin: Positive for rash.      Objective:   Physical Exam  Constitutional: She is oriented to person, place, and time. She appears well-developed and well-nourished. No distress.  HENT:  Head: Normocephalic and atraumatic.  Eyes: Conjunctivae and EOM are normal.  Neck: Neck supple. No tracheal deviation present.  Cardiovascular: Normal rate.  Pulmonary/Chest: Effort normal. No respiratory distress.  Musculoskeletal: Normal range of motion.  Neurological: She is alert and oriented to  person, place, and time.  Skin: Skin is warm and dry. Rash noted. Rash is papular and urticarial.  Central papule with large urticarial rash around.  Psychiatric: She has a normal mood and affect. Her behavior is normal.  Nursing note and vitals reviewed.  BP 126/84 (BP Location: Left Arm, Patient Position: Sitting, Cuff Size: Normal)   Pulse 64   Temp 98.3 F (36.8 C) (Oral)   Resp 16   Ht 5\' 2"  (1.575 m)   Wt 162 lb 3.2 oz (73.6 kg)   SpO2 99%   BMI 29.67 kg/m        Assessment & Plan:   1. Rash and nonspecific skin eruption   Looks like small bit - tiny papule, surrounded by urticarial type erythemaouts rash, very pruritis. If on exposed areas of skin - looks like could be spider or bed bugs w/ strong reaction - mattress is new as of several weeks ago - was in plastic so I wonder if something could have got on it in the warehouse - pt agrees to exam her bedding/mattress thoroughly at home.  Meds ordered this encounter  Medications  . cetirizine (ZYRTEC) 10 MG tablet    Sig: Take 1 tablet (10 mg total) by mouth at bedtime.    Dispense:  30 tablet    Refill:  1  . ranitidine (ZANTAC) 150 MG tablet    Sig: Take 1 tablet (150 mg total) by mouth 2 (two) times  daily.    Dispense:  60 tablet    Refill:  0  . triamcinolone cream (KENALOG) 0.1 %    Sig: Apply 1 application topically 3 (three) times daily.    Dispense:  80 g    Refill:  0    I personally performed the services described in this documentation, which was scribed in my presence. The recorded information has been reviewed and considered, and addended by me as needed.   Norberto SorensonEva Adela Esteban, M.D.  [facility] at [facility]  [place] [address] [place], [place] [id number] [phone] phone [phone] fax  10/21/17 11:40 AM

## 2017-10-07 NOTE — Patient Instructions (Addendum)
IF you received an x-ray today, you will receive an invoice from [facility]. Please contact [facility] at [phone] with questions or concerns regarding your invoice.   IF you received labwork today, you will receive an invoice from [place]. Please contact [facility] at [phone] with questions or concerns regarding your invoice.   Our billing staff will not be able to assist you with questions regarding bills from these companies.  You will be contacted with the lab results as soon as they are available. The fastest way to get your results is to activate your My Chart account. Instructions are located on the last page of this paperwork. If you have not heard from [place] regarding the results in 2 weeks, please contact this office.    Hives Hives (urticaria) are itchy, red, swollen areas on your skin. Hives can appear on any part of your body and can vary in size. They can be as small as the tip of a pen or much larger. Hives often fade within 24 hours (acute hives). In other cases, new hives appear after old ones fade. This cycle can continue for several days or weeks (chronic hives). Hives result from your body's reaction to an irritant or to something that you are allergic to (trigger). When you are exposed to a trigger, your body releases a chemical (histamine) that causes redness, itching, and swelling. You can get hives immediately after being exposed to a trigger or hours later. Hives do not spread from person to person (are not contagious). Your hives may get worse with scratching, exercise, and emotional stress. What are the causes? Causes of this condition include:  Allergies to certain foods or ingredients.  Insect bites or stings.  Exposure to pollen or pet dander.  Contact with latex or chemicals.  Spending time in sunlight, heat, or cold (exposure).  Exercise.  Stress.  You can also get hives from some medical conditions and treatments. These  include:  Viruses, including the common cold.  Bacterial infections, such as urinary tract infections and strep throat.  Disorders such as vasculitis, lupus, or thyroid disease.  Certain medications.  Allergy shots.  Blood transfusions.  Sometimes, the cause of hives is not known (idiopathic hives). What increases the risk? This condition is more likely to develop in:  Women.  People who have food allergies, especially to citrus fruits, milk, eggs, peanuts, tree nuts, or shellfish.  People who are allergic to: ? Medicines. ? Latex. ? Insects. ? Animals. ? Pollen.  People who have certain medical conditions, includinglupus or thyroid disease.  What are the signs or symptoms? The main symptom of this condition is raised, itchyred or white bumps or patches on your skin. These areas may:  Become large and swollen (welts).  Change in shape and location, quickly and repeatedly.  Be separate hives or connect over a large area of skin.  Sting or become painful.  Turn white when pressed in the center (blanch).  In severe cases, yourhands, feet, and face may also become swollen. This may occur if hives develop deeper in your skin. How is this diagnosed? This condition is diagnosed based on your symptoms, medical history, and physical exam. Your skin, urine, or blood may be tested to find out what is causing your hives and to rule out other health issues. Your health care provider may also remove a small sample of skin from the affected area and examine it under a microscope (biopsy). How is this treated? Treatment depends  on the severity of your condition. Your health care provider may recommend using cool, wet cloths (cool compresses) or taking cool showers to relieve itching. Hives are sometimes treated with medicines, including:  Antihistamines.  Corticosteroids.  Antibiotics.  An injectable medicine (omalizumab). Your health care provider may prescribe this if you  have chronic idiopathic hives and you continue to have symptoms even after treatment with antihistamines.  Severe cases may require an emergency injection of adrenaline (epinephrine) to prevent a life-threatening allergic reaction (anaphylaxis). Follow these instructions at home: Medicines  Take or apply over-the-counter and prescription medicines only as told by your health care provider.  If you were prescribed an antibiotic medicine, use it as told by your health care provider. Do not stop taking the antibiotic even if you start to feel better. Skin Care  Apply cool compresses to the affected areas.  Do not scratch or rub your skin. General instructions  Do not take hot showers or baths. This can make itching worse.  Do not wear tight-fitting clothing.  Use sunscreen and wear protective clothing when you are outside.  Avoid any substances that cause your hives. Keep a journal to help you track what causes your hives. Write down: ? What medicines you take. ? What you eat and drink. ? What products you use on your skin.  Keep all follow-up visits as told by your health care provider. This is important. Contact a health care provider if:  Your symptoms are not controlled with medicine.  Your joints are painful or swollen. Get help right away if:  You have a fever.  You have pain in your abdomen.  Your tongue or lips are swollen.  Your eyelids are swollen.  Your chest or throat feels tight.  You have trouble breathing or swallowing. These symptoms may represent a serious problem that is an emergency. Do not wait to see if the symptoms will go away. Get medical help right away. Call your local emergency services (911 in the [place] Do not drive yourself to the hospital. This information is not intended to replace advice given to you by your health care provider. Make sure you discuss any questions you have with your health care provider. Document Released: 04/09/2005  Document Revised: 09/07/2015 Document Reviewed: 01/26/2015 Elsevier Interactive Patient Education  2018 [employer].   IT sales professional, Adult An insect bite can make your skin red, itchy, and swollen. An insect bite is different from an insect sting, which happens when an insect injects poison (venom) into the skin. Some insects can spread disease to people through a bite. However, most insect bites do not lead to disease and are not serious. What are the causes? Insects may bite for a variety of reasons, including:  Hunger.  To defend themselves.  Insects that bite include:  Spiders.  Mosquitoes.  Ticks.  Fleas.  Ants.  Flies.  Bedbugs.  What are the signs or symptoms? Symptoms of this condition include:  Itching or pain in the bite area.  Redness and swelling in the bite area.  An open wound (skin ulcer).  In many cases, symptoms last for 2-4 days. How is this diagnosed? This condition is usually diagnosed based on symptoms and a physical exam. How is this treated? Treatment is usually not needed. Symptoms often go away on their own. When treatment is recommended, it may involve:  Applying a cream or lotion to the bitten area. This treatment helps with itching.  Taking an antibiotic medicine. This treatment is needed  if the bite area gets infected.  Getting a tetanus shot.  Applying ice to the affected area.  Medicines called antihistamines. This treatment is needed if you develop an allergic reaction to the insect bite.  Follow these instructions at home: Bite area care  Do not scratch the bite area.  Keep the bite area clean and dry. Wash it every day with soap and water as told by your health care provider.  Check the bite area every day for signs of infection. Check for: ? More redness, swelling, or pain. ? Fluid or blood. ? Warmth. ? Pus. Managing pain, itching, and swelling   You may apply a baking soda paste, cortisone cream, or calamine  lotion to the bite area as told by your health care provider.  If directed, applyice to the bite area. ? Put ice in a plastic bag. ? Place a towel between your skin and the bag. ? Leave the ice on for 20 minutes, 2-3 times per day. Medicines  Apply or take over-the-counter and prescription medicines only as told by your health care provider.  If you were prescribed an antibiotic medicine, use it as told by your health care provider. Do not stop using the antibiotic even if your condition improves. General instructions  Keep all follow-up visits as told by your health care provider. This is important. How is this prevented? To help reduce your risk of insect bites:  When you are outdoors, wear clothing that covers your arms and legs.  Use insect repellent. The best insect repellents contain: ? DEET, picaridin, oil of lemon eucalyptus (OLE), or IR3535. ? Higher amounts of an active ingredient.  If your home windows do not have screens, consider installing them.  Contact a health care provider if:  You have more redness, swelling, or pain in the bite area.  You have fluid, blood, or pus coming from the bite area.  The bite area feels warm to the touch.  You have a fever. Get help right away if:  You have joint pain.  You have a rash.  You have shortness of breath.  You feel unusually tired or sleepy.  You have neck pain.  You have a headache.  You have unusual weakness.  You have chest pain.  You have nausea, vomiting, or pain in the abdomen. This information is not intended to replace advice given to you by your health care provider. Make sure you discuss any questions you have with your health care provider. Document Released: 05/17/2004 Document Revised: 12/07/2015 Document Reviewed: 10/17/2015 Elsevier Interactive Patient Education  2018 [employer].  Bedbugs Bedbugs are tiny bugs that live in and around beds. They stay hidden during the day, and they  come out at night and bite. Bedbugs need blood to live and grow. Where are bedbugs found? Bedbugs can be found anywhere, whether a place is clean or dirty. They are most often found in places where many people come and go, such as hotels, shelters, dorms, and health care settings. It is also common for them to be found in homes where there are many birds or bats nearby. What are bedbug bites like? A bedbug bite leaves a small red bump with a darker red dot in the middle. The bump may appear soon after a person is bitten or a day or more later. Bedbug bites usually do not hurt, but they may itch. Most people do not need treatment for bedbug bites. The bumps usually go away on their own in  a few days. How do I check for bedbugs? Bedbugs are reddish-brown, oval, and flat. They range in size from 1 mm to 7 mm and they cannot fly. Look for bedbugs in these places:  On mattresses, bed frames, headboards, and box springs.  On drapes and curtains in bedrooms.  Under carpeting in bedrooms.  Behind electrical outlets.  Behind any wallpaper that is peeling.  Inside luggage.  Also look for black or red spots or stains on or near the bed. Stains can come from bedbugs that have been crushed or from bedbug waste. What should I do if I find bedbugs? When Traveling If you find bedbugs while traveling, check all of your possessions carefully before you bring them into your home. Consider throwing away anything that has bedbugs on it. At Home If you find bedbugs at home, your bedroom may need to be treated by a pest control expert. You may also need to throw away mattresses or luggage. To help keep bedbugs from coming back, consider taking these actions:  Put a plastic cover over your mattress.  Wash your clothes and bedding in water that is hotter than 120F (48.9C) and dry them on a hot setting. Bedbugs are killed by high temperatures.  Vacuum often around the bed and in all of the cracks and  crevices where the bugs might hide.  Carefully check all used furniture, bedding, or clothes that you bring into your home.  Eliminate bird nests and bat roosts that are near your home.  In Your Bed If you find bedbugs in your bed, consider wearing pajamas that have long sleeves and pant legs. Bedbugs usually bite areas of the skin that are not covered. This information is not intended to replace advice given to you by your health care provider. Make sure you discuss any questions you have with your health care provider. Document Released: 05/12/2010 Document Revised: 09/15/2015 Document Reviewed: 04/05/2014 Elsevier Interactive Patient Education  [employer]2018 [employer] Inc[employer]

## 2017-10-14 ENCOUNTER — Telehealth: Payer: Self-pay | Admitting: Family Medicine

## 2017-10-14 NOTE — Telephone Encounter (Signed)
Copied from CRM [id number]. Topic: Quick Communication - See Telephone Encounter >> Oct 14, 2017 11:49 AM Arlyss Gandyichardson, Minda Faas N, NT wrote: CRM for notification. See Telephone encounter for: 10/14/17. Pt calling and states she was seen last week with Dr. Clelia CroftShaw regarding a rash on her skin and she would like to know how long it takes to see some results from using the triamcinolone cream (KENALOG) 0.1 %. She state she is still very itchy.

## 2017-10-15 NOTE — Telephone Encounter (Signed)
Pt. Reports she is still "breaking out " on her arms, chest and upper back. Areas are still itchy. States she checked her mattress - states it is new and she didn't see evidence of "bugs". Asking if a different cream can be called in.

## 2017-10-17 NOTE — Telephone Encounter (Signed)
Message re: continued rash and itching - req ? Different cream.

## 2017-10-17 NOTE — Telephone Encounter (Signed)
Pt called to f/up on this msg

## 2017-10-19 MED ORDER — BETAMETHASONE VALERATE 0.1 % EX OINT: 1.0000 "application " | TOPICAL_OINTMENT | Freq: Two times a day (BID) | CUTANEOUS | 0 refills | Status: DC

## 2017-10-19 MED ORDER — HYDROXYZINE HCL 25 MG PO TABS: 25.0000 mg | ORAL_TABLET | Freq: Three times a day (TID) | ORAL | 0 refills | Status: DC | PRN

## 2017-10-19 NOTE — Telephone Encounter (Signed)
Spoke with pt - advised with Dr. Alver FisherShaw's message - pt made appt

## 2017-10-19 NOTE — Telephone Encounter (Signed)
Sent in different cream but I am concerned it will not work. Try prn hydroxyzine pill for itching. Rec pt be seen again for repeat eval since treatment didn't work.

## 2017-10-21 ENCOUNTER — Ambulatory Visit (INDEPENDENT_AMBULATORY_CARE_PROVIDER_SITE_OTHER): Payer: BLUE CROSS/BLUE SHIELD | Admitting: Family Medicine

## 2017-10-21 ENCOUNTER — Ambulatory Visit (INDEPENDENT_AMBULATORY_CARE_PROVIDER_SITE_OTHER): Payer: BLUE CROSS/BLUE SHIELD

## 2017-10-21 ENCOUNTER — Other Ambulatory Visit: Payer: Self-pay

## 2017-10-21 VITALS — BP 122/79 | HR 71 | Temp 98.7°F | Resp 16 | Ht 62.0 in | Wt 163.0 lb

## 2017-10-21 DIAGNOSIS — R21 Rash and other nonspecific skin eruption: Secondary | ICD-10-CM

## 2017-10-21 MED ORDER — PERMETHRIN 5 % EX CREA: TOPICAL_CREAM | CUTANEOUS | 1 refills | Status: DC

## 2017-10-21 MED ORDER — PREDNISONE 10 MG PO TABS: ORAL_TABLET | ORAL | 0 refills | Status: DC

## 2017-10-21 NOTE — Patient Instructions (Addendum)
Apply the elemite tonight.  Start the prednisone tomorrow.  If you think you might be getting a new spots but not sure, repeat the elemite in 5d. If at any point you are getting worse, stop the prednisone and come back in immediately for repeat eval and likely get a biopsy - always happy to refer you to dermatology but they often take several months minimum to get into for a new pt appointment for a rash.  You can continue to take the hydroxyzine as needed for itching or sleep. YOu can apply the topical cortisone, triamcinolone or get the new cream I called into you pharmacy but I doubt these will add much to the treatment or help much so ok to stop the topical treatments as well as ok to stop the cetirizine (zyrtec) and ranitidine (zantac.)  IF you received an x-ray today, you will receive an invoice from [facility]. Please contact [facility] at [phone] with questions or concerns regarding your invoice.   IF you received labwork today, you will receive an invoice from [place]. Please contact [facility] at [phone] with questions or concerns regarding your invoice.   Our billing staff will not be able to assist you with questions regarding bills from these companies.  You will be contacted with the lab results as soon as they are available. The fastest way to get your results is to activate your My Chart account. Instructions are located on the last page of this paperwork. If you have not heard from [place] regarding the results in 2 weeks, please contact this office.     Scabies, Adult Scabies is a skin condition that happens when very small insects get under the skin (infestation). This causes a rash and severe itchiness. Scabies can spread from person to person (is contagious). If you get scabies, it is common for others in your household to get scabies too. With proper treatment, symptoms usually go away in 2-4 weeks. Scabies usually does not cause lasting problems. What  are the causes? This condition is caused by mites (Sarcoptes scabiei, or human itch mites) that can only be seen with a microscope. The mites get into the top layer of skin and lay eggs. Scabies can spread from person to person through:  Close contact with a person who has scabies.  Contact with infested items, such as towels, bedding, or clothing.  What increases the risk? This condition is more likely to develop in:  People who live in nursing homes and other extended-care facilities.  People who have sexual contact with a partner who has scabies.  Young children who attend child care facilities.  People who care for others who are at increased risk for scabies.  What are the signs or symptoms? Symptoms of this condition may include:  Severe itchiness. This is often worse at night.  A rash that includes tiny red bumps or blisters. The rash commonly occurs on the wrist, elbow, armpit, fingers, waist, groin, or buttocks. Bumps may form a line (burrow) in some areas.  Skin irritation. This can include scaly patches or sores.  How is this diagnosed? This condition is diagnosed with a physical exam. Your health care provider will look closely at your skin. In some cases, your health care provider may take a sample of your affected skin (skin scraping) and have it examined under a microscope. How is this treated? This condition may be treated with:  Medicated cream or lotion that kills the mites. This is spread on the entire  body and left on for several hours. Usually, one treatment with medicated cream or lotion is enough to kill all of the mites. In severe cases, the treatment may be repeated.  Medicated cream that relieves itching.  Medicines that help to relieve itching.  Medicines that kill the mites. This treatment is rarely used.  Follow these instructions at home:  Medicines  Take or apply over-the-counter and prescription medicines as told by your health care  provider.  Apply medicated cream or lotion as told by your health care provider.  Do not wash off the medicated cream or lotion until the necessary amount of time has passed. Skin Care  Avoid scratching your affected skin.  Keep your fingernails closely trimmed to reduce injury from scratching.  Take cool baths or apply cool washcloths to help reduce itching. General instructions  Clean all items that you recently had contact with, including bedding, clothing, and furniture. Do this on the same day that your treatment starts. ? Use hot water when you wash items. ? Place unwashable items into closed, airtight plastic bags for at least 3 days. The mites cannot live for more than 3 days away from human skin. ? Vacuum furniture and mattresses that you use.  Make sure that other people who may have been infested are examined by a health care provider. These include members of your household and anyone who may have had contact with infested items.  Keep all follow-up visits as told by your health care provider. This is important. Contact a health care provider if:  You have itching that does not go away after 4 weeks of treatment.  You continue to develop new bumps or burrows.  You have redness, swelling, or pain in your rash area after treatment.  You have fluid, blood, or pus coming from your rash. This information is not intended to replace advice given to you by your health care provider. Make sure you discuss any questions you have with your health care provider. Document Released: 12/29/2014 Document Revised: 09/15/2015 Document Reviewed: 11/09/2014 Elsevier Interactive Patient Education  [employer]2018 [employer]

## 2017-10-21 NOTE — Progress Notes (Signed)
Subjective:    Patient ID: Kristina Jacobson, female    DOB: May 29, 1955, 62 y.o.   MRN: [id number] Chief Complaint  Patient presents with  . Rash    arms and back/ x 3 wks    HPI Saw pt 2 weeks ago. At that time she had a rash that started as small itchy red bumps over the left shoulder and forearm and after several days had spread to her hands and Left breast.  She is not taking ANY medications and no exposures.  Thought that cortisone and neosporin were helping a little. I was concerned about bug bite due to the location and potentially even bed bugs vs allergie urticaria. Had pt start on zyrtec and zantac with topical triamcinolone. After about 1 week pt called in stating that she was still very itchy with breakouts on her arms, chest, and upper back. No exposures or bugs on matresses notices. She was called in hydroxyzine prn and betamethasone but counseled that best to be seen again.   No past medical history on file. Past Surgical History:  Procedure Laterality Date  . CESAREAN SECTION     Current Outpatient Medications on File Prior to Visit  Medication Sig Dispense Refill  . betamethasone valerate ointment (VALISONE) 0.1 % Apply 1 application topically 2 (two) times daily. 45 g 0  . cetirizine (ZYRTEC) 10 MG tablet Take 1 tablet (10 mg total) by mouth at bedtime. 30 tablet 1  . hydrOXYzine (ATARAX/VISTARIL) 25 MG tablet Take 1 tablet (25 mg total) by mouth 3 (three) times daily as needed for itching. 30 tablet 0  . ranitidine (ZANTAC) 150 MG tablet Take 1 tablet (150 mg total) by mouth 2 (two) times daily. 60 tablet 0  . triamcinolone cream (KENALOG) 0.1 % Apply 1 application topically 3 (three) times daily. 80 g 0   No current facility-administered medications on file prior to visit.    No Known Allergies Family History  Problem Relation Age of Onset  . Hypertension Mother   . Heart disease Father   . Hypertension Brother    Social History   Socioeconomic History    . Marital status: Unknown    Spouse name: Not on file  . Number of children: Not on file  . Years of education: Not on file  . Highest education level: Not on file  Occupational History  . Not on file  Social Needs  . Financial resource strain: Not on file  . Food insecurity:    Worry: Not on file    Inability: Not on file  . Transportation needs:    Medical: Not on file    Non-medical: Not on file  Tobacco Use  . Smoking status: Former Games developer  . Smokeless tobacco: Never Used  Substance and Sexual Activity  . Alcohol use: No    Alcohol/week: 0.0 oz  . Drug use: No  . Sexual activity: Not on file  Lifestyle  . Physical activity:    Days per week: Not on file    Minutes per session: Not on file  . Stress: Not on file  Relationships  . Social connections:    Talks on phone: Not on file    Gets together: Not on file    Attends religious service: Not on file    Active member of club or organization: Not on file    Attends meetings of clubs or organizations: Not on file    Relationship status: Not on file  Other Topics Concern  .  Not on file  Social History Narrative  . Not on file   [facility] 10/21/2017 10/07/2017 02/19/2017 07/04/2015 06/06/2015  Decreased Interest 0 0 0 0 0  Down, Depressed, Hopeless 0 0 0 0 0  PHQ - 2 Score 0 0 0 0 0     Review of Systems  Constitutional: Negative for activity change, appetite change, chills, diaphoresis and fever.  Musculoskeletal: Negative for arthralgias and joint swelling.  Skin: Positive for color change and rash. Negative for pallor and wound.  Hematological: Negative for adenopathy. Does not bruise/bleed easily.  Psychiatric/Behavioral: Positive for sleep disturbance.       Objective:   Physical Exam  Constitutional: She is oriented to person, place, and time. She appears well-developed and well-nourished. No distress.  HENT:  Head: Normocephalic and atraumatic.  Right Ear: External ear normal.  Eyes:  Conjunctivae are normal. No scleral icterus.  Pulmonary/Chest: Effort normal.  Neurological: She is alert and oriented to person, place, and time.  Skin: Skin is warm and dry. Rash noted. Rash is papular, nodular and urticarial. She is not diaphoretic. No erythema.  Psychiatric: She has a normal mood and affect. Her behavior is normal.   Left flexor surface forearm below:    Left lateral upper arm above  Upper left back below:     BP 122/79   Pulse 71   Temp 98.7 F (37.1 C) (Oral)   Resp 16   Ht 5\' 2"  (1.575 m)   Wt 163 lb (73.9 kg)   SpO2 98%   BMI 29.81 kg/m      Assessment & Plan:   1. Rash and nonspecific skin eruption     Orders Placed This Encounter  Procedures  . DG Chest 2 View    Standing Status:   Future    Number of Occurrences:   1    Standing Expiration Date:   10/21/2018    Order Specific Question:   Reason for Exam (SYMPTOM  OR DIAGNOSIS REQUIRED)    Answer:   rash possible erythema nodosum so r/o sarcoid, tb, and lymphoma    Order Specific Question:   Preferred imaging location?    Answer:   External  . CBC with Differential/Platelet  . Sedimentation rate  . C-reactive protein    Meds ordered this encounter  Medications  . permethrin (ELIMITE) 5 % cream    Sig: Apply once topically from neck down covering skin completely at night, wash off in a.m. after >/=8 hrs. Repeat after 5d prn    Dispense:  60 g    Refill:  1  . predniSONE (DELTASONE) 10 MG tablet    Sig: 6-5-4-3-2-1 tabs po qd    Dispense:  21 tablet    Refill:  0     Norberto SorensonEva Aryam Zhan, M.D.  [facility]  [place] [address] [place], [place] [address] [phone] phone [phone] fax  11/06/17 9:43 AM

## 2017-10-22 LAB — SEDIMENTATION RATE: Sed Rate: 25 mm/hr (ref 0–40)

## 2017-10-22 LAB — CBC WITH DIFFERENTIAL/PLATELET
Basophils Absolute: 0 10*3/uL (ref 0.0–0.2)
Basos: 1 %
EOS (ABSOLUTE): 0.2 10*3/uL (ref 0.0–0.4)
EOS: 3 %
Hematocrit: 42.4 % (ref 34.0–46.6)
Hemoglobin: 13.3 g/dL (ref 11.1–15.9)
IMMATURE GRANS (ABS): 0 10*3/uL (ref 0.0–0.1)
IMMATURE GRANULOCYTES: 0 %
LYMPHS: 30 %
Lymphocytes Absolute: 1.5 10*3/uL (ref 0.7–3.1)
MCH: 27.2 pg (ref 26.6–33.0)
MCHC: 31.4 g/dL — ABNORMAL LOW (ref 31.5–35.7)
MCV: 87 fL (ref 79–97)
Monocytes Absolute: 0.3 10*3/uL (ref 0.1–0.9)
Monocytes: 7 %
NEUTROS PCT: 59 %
Neutrophils Absolute: 2.9 10*3/uL (ref 1.4–7.0)
PLATELETS: 273 10*3/uL (ref 150–450)
RBC: 4.89 x10E6/uL (ref 3.77–5.28)
RDW: 14.1 % (ref 12.3–15.4)
WBC: 4.9 10*3/uL (ref 3.4–10.8)

## 2017-10-22 LAB — C-REACTIVE PROTEIN: CRP: 7 mg/L (ref 0–10)

## 2017-10-28 ENCOUNTER — Ambulatory Visit: Payer: Self-pay | Admitting: *Deleted

## 2017-10-28 NOTE — Telephone Encounter (Signed)
Pt reports worsening rash. Seen 10/07/17 and 10/21/17 by Dr. Clelia CroftShaw. Pt states she has completed all medications, creams as directed. Completed course of prednisone this am. States rash is presently on back, chest "Few on legs and hands now." Also on arms as previously. Itching and burning, worse at night. Denies any swelling, SOB. Afebrile. Appt made with Kristina Jacobson. Mani for tomorrow. Care advise given per protocol.  Reason for Disposition . SEVERE itching (i.e., interferes with sleep, normal activities or school)  Answer Assessment - Initial Assessment Questions 1. APPEARANCE of RASH: "Describe the rash." (e.g., spots, blisters, raised areas, skin peeling, scaly)     Worsening, small papules 2. SIZE: "How big are the spots?" (e.g., tip of pen, eraser, coin; inches, centimeters)     small 3. LOCATION: "Where is the rash located?"     Arms, chest back "Few on legs and hands." 4. COLOR: "What color is the rash?" (Note: It is difficult to assess rash color in people with darker-colored skin. When this situation occurs, simply ask the caller to describe what they see.)     red 5. ONSET: "When did the rash begin?"     10/07/17 seen by Dr. Clelia CroftShaw, again on 10/21/17 6. FEVER: "Do you have a fever?" If so, ask: "What is your temperature, how was it measured, and when did it start?"     no 7. ITCHING: "Does the rash itch?" If so, ask: "How bad is the itch?" (Scale 1-10; or mild, moderate, severe)     severe 8. CAUSE: "What do you think is causing the rash?"     unsure 9. MEDICATION FACTORS: "Have you started any new medications within the last 2 weeks?" (e.g., antibiotics)      no 10. OTHER SYMPTOMS: "Do you have any other symptoms?" (e.g., dizziness, headache, sore throat, joint pain)       no  Protocols used: RASH OR REDNESS - [facility]

## 2017-10-29 ENCOUNTER — Encounter: Payer: Self-pay | Admitting: Urgent Care

## 2017-10-29 ENCOUNTER — Ambulatory Visit (INDEPENDENT_AMBULATORY_CARE_PROVIDER_SITE_OTHER): Payer: BLUE CROSS/BLUE SHIELD | Admitting: Urgent Care

## 2017-10-29 VITALS — BP 112/74 | HR 70 | Temp 98.3°F | Resp 16 | Ht 62.0 in | Wt 164.6 lb

## 2017-10-29 DIAGNOSIS — L299 Pruritus, unspecified: Secondary | ICD-10-CM

## 2017-10-29 DIAGNOSIS — R21 Rash and other nonspecific skin eruption: Secondary | ICD-10-CM

## 2017-10-29 MED ORDER — HYDROXYZINE HCL 50 MG PO TABS: 50.0000 mg | ORAL_TABLET | Freq: Every evening | ORAL | 1 refills | Status: DC | PRN

## 2017-10-29 MED ORDER — CEPHALEXIN 500 MG PO CAPS: 500.0000 mg | ORAL_CAPSULE | Freq: Three times a day (TID) | ORAL | 0 refills | Status: DC

## 2017-10-29 NOTE — Patient Instructions (Addendum)
Rash A rash is a change in the color of the skin. A rash can also change the way your skin feels. There are many different conditions and factors that can cause a rash. Follow these instructions at home: Pay attention to any changes in your symptoms. Follow these instructions to help with your condition: Medicine Take or apply over-the-counter and prescription medicines only as told by your health care provider. These may include:  Corticosteroid cream.  Anti-itch lotions.  Oral antihistamines.  Skin Care  Apply cool compresses to the affected areas.  Try taking a bath with: ? Epsom salts. Follow the instructions on the packaging. You can get these at your local pharmacy or grocery store. ? Baking soda. Pour a small amount into the bath as told by your health care provider. ? Colloidal oatmeal. Follow the instructions on the packaging. You can get this at your local pharmacy or grocery store.  Try applying baking soda paste to your skin. Stir water into baking soda until it reaches a paste-like consistency.  Do not scratch or rub your skin.  Avoid covering the rash. Make sure the rash is exposed to air as much as possible. General instructions  Avoid hot showers or baths, which can make itching worse. A cold shower may help.  Avoid scented soaps, detergents, and perfumes. Use gentle soaps, detergents, perfumes, and other cosmetic products.  Avoid any substance that causes your rash. Keep a journal to help track what causes your rash. Write down: ? What you eat. ? What cosmetic products you use. ? What you drink. ? What you wear. This includes jewelry.  Keep all follow-up visits as told by your health care provider. This is important. Contact a health care provider if:  You sweat at night.  You lose weight.  You urinate more than normal.  You feel weak.  You vomit.  Your skin or the whites of your eyes look yellow (jaundice).  Your skin: ? Tingles. ? Is  numb.  Your rash: ? Does not go away after several days. ? Gets worse.  You are: ? Unusually thirsty. ? More tired than normal.  You have: ? New symptoms. ? Pain in your abdomen. ? A fever. ? Diarrhea. Get help right away if:  You develop a rash that covers all or most of your body. The rash may or may not be painful.  You develop blisters that: ? Are on top of the rash. ? Grow larger or grow together. ? Are painful. ? Are inside your nose or mouth.  You develop a rash that: ? Looks like purple pinprick-sized spots all over your body. ? Has a "bull's eye" or looks like a target. ? Is not related to sun exposure, is red and painful, and causes your skin to peel. This information is not intended to replace advice given to you by your health care provider. Make sure you discuss any questions you have with your health care provider. Document Released: 03/30/2002 Document Revised: 09/13/2015 Document Reviewed: 08/25/2014 Elsevier Interactive Patient Education  2018 [employer]vier [employer]     IF you received an x-ray today, you will receive an invoice from [facility]. Please contact [facility] at [phone] with questions or concerns regarding your invoice.   IF you received labwork today, you will receive an invoice from [facility]. Please contact [facility] at [phone] with questions or concerns regarding your invoice.   Our billing staff will not be able to assist you with questions regarding bills from these   companies.  You will be contacted with the lab results as soon as they are available. The fastest way to get your results is to activate your My Chart account. Instructions are located on the last page of this paperwork. If you have not heard from us regarding the results in 2 weeks, please contact this office.      

## 2017-10-29 NOTE — Progress Notes (Signed)
   MRN: [id number] DOB: September 24, 1955  Subjective:   Kristina RainwaterDenise E Habermehl is a 62 y.o. female presenting for follow up on persistent, worsening itchy rash. This is the patient's third visit to our clinic. She has had the rash the better part of a month.  Reports that it started out as itchy and now spread to the upper arm, chest, back, feels like it's on her face now. Reports that she gets very itchy, worse at night. States that she just bought a new mattress and is ~2 months old. Also has new sheets. Has tried prednisone, permethrin, topical steroids, oral antihistamines.   Angelique BlonderDenise is not currently taking any medications. Also has No Known Allergies.  Angelique BlonderDenise denies past medical history and  has a past surgical history that includes Cesarean section.  Objective:   Vitals: BP 112/74   Pulse 70   Temp 98.3 F (36.8 C) (Other (Comment))   Resp 16   Ht 5\' 2"  (1.575 m)   Wt 164 lb 9.6 oz (74.7 kg)   SpO2 100%   BMI 30.11 kg/m   Physical Exam  Constitutional: She is oriented to person, place, and time. She appears well-developed and well-nourished.  HENT:  Mouth/Throat: Oropharynx is clear and moist.  Eyes: Right eye exhibits no discharge. Left eye exhibits no discharge. No scleral icterus.  Neck: Normal range of motion. Neck supple.  Cardiovascular: Normal rate, regular rhythm, normal heart sounds and intact distal pulses. Exam reveals no gallop and no friction rub.  No murmur heard. Pulmonary/Chest: Effort normal and breath sounds normal. No stridor. No respiratory distress. She has no wheezes. She has no rales.  Lymphadenopathy:    She has no cervical adenopathy.  Neurological: She is alert and oriented to person, place, and time.  Skin: Skin is warm and dry. Rash (solitary erythematous nodules ~1cm in size diffusely scattered over arms, torso, neck and smaller/fewer lesions on her face) noted.  Psychiatric: She has a normal mood and affect.   Assessment and Plan :   Rash and nonspecific  skin eruption - Plan: Ambulatory referral to Dermatology, Rickettsial Fever Group IgG/M, Lyme Ab/Western Blot Reflex, Ehrlichia Antibody Panel  Itching - Plan: Ambulatory referral to Dermatology  Referral to dermatology pending. Patient is very frustrated with lack of improvement/worsening of her rash. Patient wanted to dry a different treatment. Will use Keflex to address bacterial source. Labs reviewed with patient, tick borne illness titers pending. Counseled patient that it can take up to 2 weeks to receive a phone call for an appointment.  Wallis BambergMario Iszabella Hebenstreit, PA-C [facility] [phone] 10/29/2017 2:17 PM

## 2017-10-29 NOTE — Addendum Note (Signed)
Addended by: Wallis BambergMANI, Jessee Newnam on: 10/29/2017 02:52 PM   Modules accepted: Orders

## 2017-10-31 LAB — RICKETTSIAL FEVER GROUP IGG/M
Spotted Fever Group IgG: <1:64 {titer}
Spotted Fever Group IgM: <1:64 {titer}
Typhus Fever Group IgM: <1:64 {titer}

## 2017-10-31 LAB — EHRLICHIA ANTIBODY PANEL
E. CHAFFEENSIS (HME) IGM TITER: NEGATIVE
E. CHAFFEENSIS IGG AB: NEGATIVE
HGE IgG Titer: NEGATIVE
HGE IgM Titer: NEGATIVE

## 2017-10-31 LAB — LYME AB/WESTERN BLOT REFLEX: Lyme IgG/IgM Ab: <0.91 {ISR} (ref 0.00–0.90)

## 2017-10-31 LAB — EOSINOPHIL COUNT: EOS (ABSOLUTE): 0.2 10*3/uL (ref 0.0–0.4)

## 2017-11-06 ENCOUNTER — Ambulatory Visit (INDEPENDENT_AMBULATORY_CARE_PROVIDER_SITE_OTHER): Payer: BLUE CROSS/BLUE SHIELD | Admitting: Family Medicine

## 2017-11-06 ENCOUNTER — Encounter: Payer: Self-pay | Admitting: Family Medicine

## 2017-11-06 ENCOUNTER — Other Ambulatory Visit: Payer: Self-pay

## 2017-11-06 VITALS — BP 114/74 | HR 78 | Temp 98.1°F | Ht 62.0 in | Wt 164.0 lb

## 2017-11-06 DIAGNOSIS — Z113 Encounter for screening for infections with a predominantly sexual mode of transmission: Secondary | ICD-10-CM | POA: Diagnosis not present

## 2017-11-06 DIAGNOSIS — Z0000 Encounter for general adult medical examination without abnormal findings: Secondary | ICD-10-CM | POA: Diagnosis not present

## 2017-11-06 DIAGNOSIS — Z1231 Encounter for screening mammogram for malignant neoplasm of breast: Secondary | ICD-10-CM

## 2017-11-06 DIAGNOSIS — Z136 Encounter for screening for cardiovascular disorders: Secondary | ICD-10-CM

## 2017-11-06 DIAGNOSIS — N888 Other specified noninflammatory disorders of cervix uteri: Secondary | ICD-10-CM

## 2017-11-06 DIAGNOSIS — Z1329 Encounter for screening for other suspected endocrine disorder: Secondary | ICD-10-CM

## 2017-11-06 DIAGNOSIS — Z114 Encounter for screening for human immunodeficiency virus [HIV]: Secondary | ICD-10-CM

## 2017-11-06 DIAGNOSIS — R3121 Asymptomatic microscopic hematuria: Secondary | ICD-10-CM | POA: Diagnosis not present

## 2017-11-06 DIAGNOSIS — Z1383 Encounter for screening for respiratory disorder NEC: Secondary | ICD-10-CM

## 2017-11-06 DIAGNOSIS — Z1212 Encounter for screening for malignant neoplasm of rectum: Secondary | ICD-10-CM

## 2017-11-06 DIAGNOSIS — N63 Unspecified lump in unspecified breast: Secondary | ICD-10-CM

## 2017-11-06 DIAGNOSIS — Z1159 Encounter for screening for other viral diseases: Secondary | ICD-10-CM

## 2017-11-06 DIAGNOSIS — Z124 Encounter for screening for malignant neoplasm of cervix: Secondary | ICD-10-CM

## 2017-11-06 DIAGNOSIS — Z130 Encounter for screening for diseases of the blood and blood-forming organs and certain disorders involving the immune mechanism: Secondary | ICD-10-CM

## 2017-11-06 DIAGNOSIS — Z1211 Encounter for screening for malignant neoplasm of colon: Secondary | ICD-10-CM

## 2017-11-06 DIAGNOSIS — Z1389 Encounter for screening for other disorder: Secondary | ICD-10-CM

## 2017-11-06 LAB — POCT URINALYSIS DIP (MANUAL ENTRY)
BILIRUBIN UA: NEGATIVE
BILIRUBIN UA: NEGATIVE mg/dL
Glucose, UA: NEGATIVE mg/dL
Leukocytes, UA: NEGATIVE
Nitrite, UA: NEGATIVE
PH UA: 5.5 (ref 5.0–8.0)
Protein Ur, POC: NEGATIVE mg/dL
Spec Grav, UA: 1.02 (ref 1.010–1.025)
Urobilinogen, UA: 0.2 E.U./dL

## 2017-11-06 LAB — POC MICROSCOPIC URINALYSIS (UMFC): MUCUS RE: ABSENT

## 2017-11-06 NOTE — Patient Instructions (Addendum)
IF you received an x-ray today, you will receive an invoice from [facility]. Please contact [facility] at [phone] with questions or concerns regarding your invoice.   IF you received labwork today, you will receive an invoice from [place]. Please contact [facility] at [phone] with questions or concerns regarding your invoice.   Our billing staff will not be able to assist you with questions regarding bills from these companies.  You will be contacted with the lab results as soon as they are available. The fastest way to get your results is to activate your My Chart account. Instructions are located on the last page of this paperwork. If you have not heard from [place] regarding the results in 2 weeks, please contact this office.      Hematuria, Adult Hematuria is blood in your urine. It can be caused by a bladder infection, kidney infection, prostate infection, kidney stone, or cancer of your urinary tract. Infections can usually be treated with medicine, and a kidney stone usually will pass through your urine. If neither of these is the cause of your hematuria, further workup to find out the reason may be needed. It is very important that you tell your health care provider about any blood you see in your urine, even if the blood stops without treatment or happens without causing pain. Blood in your urine that happens and then stops and then happens again can be a symptom of a very serious condition. Also, pain is not a symptom in the initial stages of many urinary cancers. Follow these instructions at home:  Drink lots of fluid, 3-4 quarts a day. If you have been diagnosed with an infection, cranberry juice is especially recommended, in addition to large amounts of water.  Avoid caffeine, tea, and carbonated beverages because they tend to irritate the bladder.  Avoid alcohol because it may irritate the prostate.  Take all medicines as directed by your health  care provider.  If you were prescribed an antibiotic medicine, finish it all even if you start to feel better.  If you have been diagnosed with a kidney stone, follow your health care provider's instructions regarding straining your urine to catch the stone.  Empty your bladder often. Avoid holding urine for long periods of time.  After a bowel movement, women should cleanse front to back. Use each tissue only once.  Empty your bladder before and after sexual intercourse if you are a female. Contact a health care provider if:  You develop back pain.  You have a fever.  You have a feeling of sickness in your stomach (nausea) or vomiting.  Your symptoms are not better in 3 days. Return sooner if you are getting worse. Get help right away if:  You develop severe vomiting and are unable to keep the medicine down.  You develop severe back or abdominal pain despite taking your medicines.  You begin passing a large amount of blood or clots in your urine.  You feel extremely weak or faint, or you pass out. This information is not intended to replace advice given to you by your health care provider. Make sure you discuss any questions you have with your health care provider. Document Released: 04/09/2005 Document Revised: 09/15/2015 Document Reviewed: 12/08/2012 Elsevier Interactive Patient Education  2017 [place] Maintenance, Female Adopting a healthy lifestyle and getting preventive care can go a long way to promote health and wellness. Talk with your health care provider about what schedule of regular  examinations is right for you. This is a good chance for you to check in with your provider about disease prevention and staying healthy. In between checkups, there are plenty of things you can do on your own. Experts have done a lot of research about which lifestyle changes and preventive measures are most likely to keep you healthy. Ask your health care provider for more  information. Weight and diet Eat a healthy diet  Be sure to include plenty of vegetables, fruits, low-fat dairy products, and lean protein.  Do not eat a lot of foods high in solid fats, added sugars, or salt.  Get regular exercise. This is one of the most important things you can do for your health. ? Most adults should exercise for at least 150 minutes each week. The exercise should increase your heart rate and make you sweat (moderate-intensity exercise). ? Most adults should also do strengthening exercises at least twice a week. This is in addition to the moderate-intensity exercise.  Maintain a healthy weight  Body mass index (BMI) is a measurement that can be used to identify possible weight problems. It estimates body fat based on height and weight. Your health care provider can help determine your BMI and help you achieve or maintain a healthy weight.  For females 29 years of age and older: ? A BMI below 18.5 is considered underweight. ? A BMI of 18.5 to 24.9 is normal. ? A BMI of 25 to 29.9 is considered overweight. ? A BMI of 30 and above is considered obese.  Watch levels of cholesterol and blood lipids  You should start having your blood tested for lipids and cholesterol at 62 years of age, then have this test every 5 years.  You may need to have your cholesterol levels checked more often if: ? Your lipid or cholesterol levels are high. ? You are older than 62 years of age. ? You are at high risk for heart disease.  Cancer screening Lung Cancer  Lung cancer screening is recommended for adults 61-34 years old who are at high risk for lung cancer because of a history of smoking.  A yearly low-dose CT scan of the lungs is recommended for people who: ? Currently smoke. ? Have quit within the past 15 years. ? Have at least a 30-pack-year history of smoking. A pack year is smoking an average of one pack of cigarettes a day for 1 year.  Yearly screening should continue  until it has been 15 years since you quit.  Yearly screening should stop if you develop a health problem that would prevent you from having lung cancer treatment.  Breast Cancer  Practice breast self-awareness. This means understanding how your breasts normally appear and feel.  It also means doing regular breast self-exams. Let your health care provider know about any changes, no matter how small.  If you are in your 20s or 30s, you should have a clinical breast exam (CBE) by a health care provider every 1-3 years as part of a regular health exam.  If you are 65 or older, have a CBE every year. Also consider having a breast X-ray (mammogram) every year.  If you have a family history of breast cancer, talk to your health care provider about genetic screening.  If you are at high risk for breast cancer, talk to your health care provider about having an MRI and a mammogram every year.  Breast cancer gene (BRCA) assessment is recommended for women who have family  members with BRCA-related cancers. BRCA-related cancers include: ? Breast. ? Ovarian. ? Tubal. ? Peritoneal cancers.  Results of the assessment will determine the need for genetic counseling and BRCA1 and BRCA2 testing.  Cervical Cancer Your health care provider may recommend that you be screened regularly for cancer of the pelvic organs (ovaries, uterus, and vagina). This screening involves a pelvic examination, including checking for microscopic changes to the surface of your cervix (Pap test). You may be encouraged to have this screening done every 3 years, beginning at age 45.  For women ages 43-65, health care providers may recommend pelvic exams and Pap testing every 3 years, or they may recommend the Pap and pelvic exam, combined with testing for human papilloma virus (HPV), every 5 years. Some types of HPV increase your risk of cervical cancer. Testing for HPV may also be done on women of any age with unclear Pap test  results.  Other health care providers may not recommend any screening for nonpregnant women who are considered low risk for pelvic cancer and who do not have symptoms. Ask your health care provider if a screening pelvic exam is right for you.  If you have had past treatment for cervical cancer or a condition that could lead to cancer, you need Pap tests and screening for cancer for at least 20 years after your treatment. If Pap tests have been discontinued, your risk factors (such as having a new sexual partner) need to be reassessed to determine if screening should resume. Some women have medical problems that increase the chance of getting cervical cancer. In these cases, your health care provider may recommend more frequent screening and Pap tests.  Colorectal Cancer  This type of cancer can be detected and often prevented.  Routine colorectal cancer screening usually begins at 62 years of age and continues through 62 years of age.  Your health care provider may recommend screening at an earlier age if you have risk factors for colon cancer.  Your health care provider may also recommend using home test kits to check for hidden blood in the stool.  A small camera at the end of a tube can be used to examine your colon directly (sigmoidoscopy or colonoscopy). This is done to check for the earliest forms of colorectal cancer.  Routine screening usually begins at age 42.  Direct examination of the colon should be repeated every 5-10 years through 62 years of age. However, you may need to be screened more often if early forms of precancerous polyps or small growths are found.  Skin Cancer  Check your skin from head to toe regularly.  Tell your health care provider about any new moles or changes in moles, especially if there is a change in a mole's shape or color.  Also tell your health care provider if you have a mole that is larger than the size of a pencil eraser.  Always use sunscreen.  Apply sunscreen liberally and repeatedly throughout the day.  Protect yourself by wearing long sleeves, pants, a wide-brimmed hat, and sunglasses whenever you are outside.  Heart disease, diabetes, and high blood pressure  High blood pressure causes heart disease and increases the risk of stroke. High blood pressure is more likely to develop in: ? People who have blood pressure in the high end of the normal range (130-139/85-89 mm Hg). ? People who are overweight or obese. ? People who are African American.  If you are 18-74 years of age, have your blood pressure  checked every 3-5 years. If you are 75 years of age or older, have your blood pressure checked every year. You should have your blood pressure measured twice-once when you are at a hospital or clinic, and once when you are not at a hospital or clinic. Record the average of the two measurements. To check your blood pressure when you are not at a hospital or clinic, you can use: ? An automated blood pressure machine at a pharmacy. ? A home blood pressure monitor.  If you are between 31 years and 20 years old, ask your health care provider if you should take aspirin to prevent strokes.  Have regular diabetes screenings. This involves taking a blood sample to check your fasting blood sugar level. ? If you are at a normal weight and have a low risk for diabetes, have this test once every three years after 62 years of age. ? If you are overweight and have a high risk for diabetes, consider being tested at a younger age or more often. Preventing infection Hepatitis B  If you have a higher risk for hepatitis B, you should be screened for this virus. You are considered at high risk for hepatitis B if: ? You were born in a country where hepatitis B is common. Ask your health care provider which countries are considered high risk. ? Your parents were born in a high-risk country, and you have not been immunized against hepatitis B (hepatitis B  vaccine). ? You have HIV or AIDS. ? You use needles to inject street drugs. ? You live with someone who has hepatitis B. ? You have had sex with someone who has hepatitis B. ? You get hemodialysis treatment. ? You take certain medicines for conditions, including cancer, organ transplantation, and autoimmune conditions.  Hepatitis C  Blood testing is recommended for: ? Everyone born from [birth year] through 1965. ? Anyone with known risk factors for hepatitis C.  Sexually transmitted infections (STIs)  You should be screened for sexually transmitted infections (STIs) including gonorrhea and chlamydia if: ? You are sexually active and are younger than 62 years of age. ? You are older than 62 years of age and your health care provider tells you that you are at risk for this type of infection. ? Your sexual activity has changed since you were last screened and you are at an increased risk for chlamydia or gonorrhea. Ask your health care provider if you are at risk.  If you do not have HIV, but are at risk, it may be recommended that you take a prescription medicine daily to prevent HIV infection. This is called pre-exposure prophylaxis (PrEP). You are considered at risk if: ? You are sexually active and do not regularly use condoms or know the HIV status of your partner(s). ? You take drugs by injection. ? You are sexually active with a partner who has HIV.  Talk with your health care provider about whether you are at high risk of being infected with HIV. If you choose to begin PrEP, you should first be tested for HIV. You should then be tested every 3 months for as long as you are taking PrEP. Pregnancy  If you are premenopausal and you may become pregnant, ask your health care provider about preconception counseling.  If you may become pregnant, take 400 to 800 micrograms (mcg) of folic acid every day.  If you want to prevent pregnancy, talk to your health care provider about birth control  (contraception). Osteoporosis and  menopause  Osteoporosis is a disease in which the bones lose minerals and strength with aging. This can result in serious bone fractures. Your risk for osteoporosis can be identified using a bone density scan.  If you are 57 years of age or older, or if you are at risk for osteoporosis and fractures, ask your health care provider if you should be screened.  Ask your health care provider whether you should take a calcium or vitamin D supplement to lower your risk for osteoporosis.  Menopause may have certain physical symptoms and risks.  Hormone replacement therapy may reduce some of these symptoms and risks. Talk to your health care provider about whether hormone replacement therapy is right for you. Follow these instructions at home:  Schedule regular health, dental, and eye exams.  Stay current with your immunizations.  Do not use any tobacco products including cigarettes, chewing tobacco, or electronic cigarettes.  If you are pregnant, do not drink alcohol.  If you are breastfeeding, limit how much and how often you drink alcohol.  Limit alcohol intake to no more than 1 drink per day for nonpregnant women. One drink equals 12 ounces of beer, 5 ounces of wine, or 1 ounces of hard liquor.  Do not use street drugs.  Do not share needles.  Ask your health care provider for help if you need support or information about quitting drugs.  Tell your health care provider if you often feel depressed.  Tell your health care provider if you have ever been abused or do not feel safe at home. This information is not intended to replace advice given to you by your health care provider. Make sure you discuss any questions you have with your health care provider. Document Released: 10/23/2010 Document Revised: 09/15/2015 Document Reviewed: 01/11/2015 Elsevier Interactive Patient Education  2018 [place] are tiny bugs that live in and  around beds. They stay hidden during the day, and they come out at night and bite. Bedbugs need blood to live and grow. Where are bedbugs found? Bedbugs can be found anywhere, whether a place is clean or dirty. They are most often found in places where many people come and go, such as hotels, shelters, dorms, and health care settings. It is also common for them to be found in homes where there are many birds or bats nearby. What are bedbug bites like? A bedbug bite leaves a small red bump with a darker red dot in the middle. The bump may appear soon after a person is bitten or a day or more later. Bedbug bites usually do not hurt, but they may itch. Most people do not need treatment for bedbug bites. The bumps usually go away on their own in a few days. How do I check for bedbugs? Bedbugs are reddish-brown, oval, and flat. They range in size from 1 mm to 7 mm and they cannot fly. Look for bedbugs in these places:  On mattresses, bed frames, headboards, and box springs.  On drapes and curtains in bedrooms.  Under carpeting in bedrooms.  Behind electrical outlets.  Behind any wallpaper that is peeling.  Inside luggage.  Also look for black or red spots or stains on or near the bed. Stains can come from bedbugs that have been crushed or from bedbug waste. What should I do if I find bedbugs? When Traveling If you find bedbugs while traveling, check all of your possessions carefully before you bring them into your home. Consider throwing  away anything that has bedbugs on it. At Home If you find bedbugs at home, your bedroom may need to be treated by a pest control expert. You may also need to throw away mattresses or luggage. To help keep bedbugs from coming back, consider taking these actions:  Put a plastic cover over your mattress.  Wash your clothes and bedding in water that is hotter than 120F (48.9C) and dry them on a hot setting. Bedbugs are killed by high temperatures.  Vacuum  often around the bed and in all of the cracks and crevices where the bugs might hide.  Carefully check all used furniture, bedding, or clothes that you bring into your home.  Eliminate bird nests and bat roosts that are near your home.  In Your Bed If you find bedbugs in your bed, consider wearing pajamas that have long sleeves and pant legs. Bedbugs usually bite areas of the skin that are not covered. This information is not intended to replace advice given to you by your health care provider. Make sure you discuss any questions you have with your health care provider. Document Released: 05/12/2010 Document Revised: 09/15/2015 Document Reviewed: 04/05/2014 Elsevier Interactive Patient Education  [employer].

## 2017-11-06 NOTE — Progress Notes (Signed)
Subjective:  By signing my name below, I, Moises Blood, attest that this documentation has been prepared under the direction and in the presence of Delman Cheadle, MD. Electronically Signed: Moises Blood, [place]. 11/06/2017 , 10:08 AM .  Patient was seen in Room 3 .   Patient ID: Kristina Jacobson, female    DOB: 1955/09/19, 62 y.o.   MRN: [id number] Chief Complaint  Patient presents with  . Annual Exam    cpe   HPI  I have only seen patient for acute issues in the past. She is not fasting this morning.   Rash She has had a very pruritic rash that started in mid June which she has been seen at our clinic 3 times over the past 2 months. Initially, suspected bed bugs and was treated with zyrtec, zantac, and topical triamcinolone. She was tried on hydroxyzine, followed by topical permethrin and oral prednisone taper. Her symptoms still haven't showed improvement so after a week, she was tried on Keflex for possible secondary bacterial source, and referred to dermatology for further evaluation.   She found a bed bug recently. She had reported she had bought a new mattress recently about 2 months ago. She has an exterminator scheduled coming out to her house. She hasn't scheduled an appointment with dermatology after figuring out the cause was by bed bugs. She denies any problems with the hydroxyzine.   Primary Preventative Screenings: Cervical Cancer: hasn't had pelvic exam done in a while; will do today.  STI screening: Pt agrees to CDC recommended one time HIV/Hep C screen with labs.  Breast Cancer: hasn't had mammogram done in past few years.  Colorectal Cancer: last colonoscopy done a while ago in [place], doesn't recall exactly when or where.  Cardiac: updated EKG today.  Weight/Blood sugar/Diet/Exercise: BMI Readings from Last 3 Encounters:  11/06/17 30.00 kg/m  10/29/17 30.11 kg/m  10/21/17 29.81 kg/m   No results found for:  HGBA1C OTC/Vit/Supp/Herbal: Dentist/Optho: Immunizations:  Immunization History  Administered Date(s) Administered  . MMR 06/20/2015  . Tdap 06/06/2015     No past medical history on file. Past Surgical History:  Procedure Laterality Date  . CESAREAN SECTION     Current Outpatient Medications on File Prior to Visit  Medication Sig Dispense Refill  . hydrOXYzine (ATARAX/VISTARIL) 50 MG tablet Take 1 tablet (50 mg total) by mouth at bedtime as needed for itching. 30 tablet 1   No current facility-administered medications on file prior to visit.    No Known Allergies Family History  Problem Relation Age of Onset  . Hypertension Mother   . Heart disease Father   . Hypertension Brother    Social History   Socioeconomic History  . Marital status: Unknown    Spouse name: Not on file  . Number of children: Not on file  . Years of education: Not on file  . Highest education level: Not on file  Occupational History  . Not on file  Social Needs  . Financial resource strain: Not on file  . Food insecurity:    Worry: Not on file    Inability: Not on file  . Transportation needs:    Medical: Not on file    Non-medical: Not on file  Tobacco Use  . Smoking status: Former Research scientist (life sciences)  . Smokeless tobacco: Never Used  Substance and Sexual Activity  . Alcohol use: No    Alcohol/week: 0.0 oz  . Drug use: No  . Sexual activity: Not on file  Lifestyle  .  Physical activity:    Days per week: Not on file    Minutes per session: Not on file  . Stress: Not on file  Relationships  . Social connections:    Talks on phone: Not on file    Gets together: Not on file    Attends religious service: Not on file    Active member of club or organization: Not on file    Attends meetings of clubs or organizations: Not on file    Relationship status: Not on file  Other Topics Concern  . Not on file  Social History Narrative  . Not on file   Depression screen [facility] 2/9 11/06/2017 10/21/2017  10/07/2017 02/19/2017 07/04/2015  Decreased Interest 0 0 0 0 0  Down, Depressed, Hopeless 0 0 0 0 0  PHQ - 2 Score 0 0 0 0 0   Review of Systems  Constitutional: Negative for chills, fatigue, fever and unexpected weight change.  Respiratory: Negative for cough.   Gastrointestinal: Negative for constipation, diarrhea, nausea and vomiting.  Skin: Negative for rash and wound.  Neurological: Negative for dizziness, weakness and headaches.       Objective:   Physical Exam  Constitutional: She is oriented to person, place, and time. She appears well-developed and well-nourished. No distress.  HENT:  Head: Normocephalic and atraumatic.  Eyes: Pupils are equal, round, and reactive to light. EOM are normal.  Neck: Neck supple.  Cardiovascular: Normal rate.  Pulmonary/Chest: Effort normal. No respiratory distress.  Breast exam: approximately 1.0 inch by 0.5 inch breast mass left upper outer quadrant, lateral 2 o'clock 3 inches from center of nipple  Genitourinary:  Genitourinary Comments: Pelvic exam: large 2 cm friable purplish polyp presuming coming from cervical os but polyp was obsecuring pelvic evaluation  Musculoskeletal: Normal range of motion.  Neurological: She is alert and oriented to person, place, and time.  Skin: Skin is warm and dry.  Psychiatric: She has a normal mood and affect. Her behavior is normal.  Nursing note and vitals reviewed.   BP 114/74 (BP Location: Left Arm, Patient Position: Sitting, Cuff Size: Normal)   Pulse 78   Temp 98.1 F (36.7 C) (Oral)   Ht _0  (1.575 m)   Wt 164 lb (74.4 kg)   SpO2 95%   BMI 30.00 kg/m   EKG: NSR, no acute ischemic changes noted. Non specific flipped T-waves in lead 3 and v3; no prior EKG for comparison.  I have personally reviewed the EKG tracing and agree with the computer interpretation.  Results for orders placed or performed in visit on 11/06/17  POCT urinalysis dipstick  Result Value Ref Range   Color, UA yellow  yellow   Clarity, UA clear clear   Glucose, UA negative negative mg/dL   Bilirubin, UA negative negative   Ketones, POC UA negative negative mg/dL   Spec Grav, UA 1.020 1.010 - 1.025   Blood, UA moderate (A) negative   pH, UA 5.5 5.0 - 8.0   Protein Ur, POC negative negative mg/dL   Urobilinogen, UA 0.2 0.2 or 1.0 E.U./dL   Nitrite, UA Negative Negative   Leukocytes, UA Negative Negative        Assessment & Plan:   1. Annual physical exam - pt not fasting today so agrees to come back for labs in future -orders entered.  2. Screening for cardiovascular, respiratory, and genitourinary diseases   3. Routine screening for STI (sexually transmitted infection)   4. Encounter for screening mammogram for  breast cancer   5. Screening for cervical cancer   6. Screening for colorectal cancer   7. Screening for deficiency anemia   8. Screening for thyroid disorder   9. Screening for HIV (human immunodeficiency virus)   10. Need for hepatitis C screening test   11. Asymptomatic microscopic hematuria - check urine culture, pelvic exam, pap/sti screen today - recheck urine at f/u - if persists at f/u, suspect it might be coming from friable cervical polpy but I think we still need to r/u abnormal bladder/renal source so will need to discuss urology referral - consider cytology and renal US.  12. Breast mass in female - send for diag mam and [place] - in left breast upper outer quad  13. Cervical mass - suspect benign polyp - pt denies any sx from it though could be causing hematuria - if pap nml, I do not see any reason that this needs further eval unless she develops dyspareunia, bleeding after sex, post-menopausal vaginal bleeding or any other pelvic sxs -> then refer to gyn.    Orders Placed This Encounter  Procedures  . Urine Culture  . MM DIAG BREAST TOMO BILATERAL    [facility] ORDER EXPORTED PF YRS AGO UNKNOWN LOCATION IN [place]([facility]?)/NO NEEDS/TOMO/BC PT PER JESSICA AT [facility] NO IMAGES THERE  JL7/20     Standing Status:   Future    Number of Occurrences:   1    Standing Expiration Date:   01/08/2019    Order Specific Question:   Reason for Exam (SYMPTOM  OR DIAGNOSIS REQUIRED)    Answer:   screening, left breast mass 1 x 1/2 in 2 oclock 3 in from nipple    Order Specific Question:   Preferred imaging location?    Answer:   [facility]  . US BREAST LTD UNI LEFT INC AXILLA    [facility] CO SIGN REQ 11/08/17 PF YRS AGO UNKNOWN LOCATION IN [place]([facility]?)/NO NEEDS/TOMO/BC PT    Standing Status:   Future    Number of Occurrences:   1    Standing Expiration Date:   01/08/2019    Order Specific Question:   Reason for Exam (SYMPTOM  OR DIAGNOSIS REQUIRED)    Answer:   left breast mass 1 x 1/2 in 2 oclock 3 in from nipple    Order Specific Question:   Preferred imaging location?    Answer:   [facility]  . Lipid panel    Standing Status:   Future    Standing Expiration Date:   11/07/2018    Order Specific Question:   Has the patient fasted?    Answer:   Yes  . Comprehensive metabolic panel    Standing Status:   Future    Standing Expiration Date:   11/07/2018    Order Specific Question:   Has the patient fasted?    Answer:   Yes  . TSH    Standing Status:   Future    Standing Expiration Date:   11/07/2018  . HIV antibody    Standing Status:   Future    Standing Expiration Date:   11/07/2018  . HCV Ab w/Rflx to Verification    Standing Status:   Future    Standing Expiration Date:   11/07/2018  . Ambulatory referral to Gastroenterology    Referral Priority:   Routine    Referral Type:   Consultation    Referral Reason:   Specialty Services Required    Number of Visits Requested:  1  . POCT urinalysis dipstick  . POCT Microscopic Urinalysis ([facility])  . EKG 12-Lead    I personally performed the services described in this documentation, which was scribed in my presence. The recorded information has been reviewed and considered, and addended by me as needed.   Delman Cheadle,  M.D.  Primary Care at [facility] [address] [place], [place] [address] [phone] phone [phone] fax  01/27/18 2:23 AM

## 2017-11-07 LAB — URINE CULTURE

## 2017-11-09 LAB — PAP IG, CT-NG NAA, HPV HIGH-RISK
Chlamydia, Nuc. Acid Amp: NEGATIVE
Gonococcus by Nucleic Acid Amp: NEGATIVE
HPV, HIGH-RISK: NEGATIVE
PAP Smear Comment: 0

## 2017-11-20 ENCOUNTER — Ambulatory Visit
Admission: RE | Admit: 2017-11-20 | Discharge: 2017-11-20 | Disposition: A | Discharge status: Home / Self Care | Payer: BLUE CROSS/BLUE SHIELD | Source: Ambulatory Visit | Attending: Family Medicine | Admitting: Family Medicine

## 2017-11-20 DIAGNOSIS — N63 Unspecified lump in unspecified breast: Secondary | ICD-10-CM

## 2017-11-20 DIAGNOSIS — Z1231 Encounter for screening mammogram for malignant neoplasm of breast: Secondary | ICD-10-CM

## 2017-12-09 ENCOUNTER — Encounter: Payer: Self-pay | Admitting: Family Medicine

## 2017-12-18 ENCOUNTER — Ambulatory Visit: Payer: BLUE CROSS/BLUE SHIELD | Admitting: Family Medicine

## 2018-01-22 ENCOUNTER — Ambulatory Visit: Payer: BLUE CROSS/BLUE SHIELD | Admitting: Family Medicine

## 2018-02-05 ENCOUNTER — Telehealth: Payer: Self-pay | Admitting: Family Medicine

## 2018-02-05 NOTE — Telephone Encounter (Signed)
LVM for pt to call the office back and advise [place] if she can switch her appt on 02/26/18 from 9:40 to 9:30.  OLD APPT: 9:40 NEW APPT 9:30  If pt calls back, please place in her notes.  Thank you!

## 2018-02-26 ENCOUNTER — Ambulatory Visit: Payer: BLUE CROSS/BLUE SHIELD | Admitting: Family Medicine

## 2018-06-06 ENCOUNTER — Ambulatory Visit: Payer: BLUE CROSS/BLUE SHIELD | Admitting: Family Medicine

## 2018-06-12 ENCOUNTER — Ambulatory Visit (INDEPENDENT_AMBULATORY_CARE_PROVIDER_SITE_OTHER): Payer: BLUE CROSS/BLUE SHIELD | Admitting: Family Medicine

## 2018-06-12 ENCOUNTER — Encounter: Payer: Self-pay | Admitting: Family Medicine

## 2018-06-12 ENCOUNTER — Other Ambulatory Visit: Payer: Self-pay

## 2018-06-12 VITALS — BP 126/84 | HR 82 | Temp 97.9°F | Resp 14 | Ht 63.0 in | Wt 156.2 lb

## 2018-06-12 DIAGNOSIS — R42 Dizziness and giddiness: Secondary | ICD-10-CM | POA: Diagnosis not present

## 2018-06-12 DIAGNOSIS — J3489 Other specified disorders of nose and nasal sinuses: Secondary | ICD-10-CM

## 2018-06-12 DIAGNOSIS — Z1211 Encounter for screening for malignant neoplasm of colon: Secondary | ICD-10-CM | POA: Diagnosis not present

## 2018-06-12 DIAGNOSIS — R5383 Other fatigue: Secondary | ICD-10-CM | POA: Diagnosis not present

## 2018-06-12 LAB — POCT CBC
GRANULOCYTE PERCENT: 73.7 % (ref 37–80)
HCT, POC: 39.7 % (ref 29–41)
Hemoglobin: 13.5 g/dL (ref 11–14.6)
Lymph, poc: 1.1 (ref 0.6–3.4)
MCH, POC: 28 pg (ref 27–31.2)
MCHC: 33.9 g/dL (ref 31.8–35.4)
MCV: 82.5 fL (ref 76–111)
MID (cbc): 0.1 (ref 0–0.9)
MPV: 7.7 fL (ref 0–99.8)
POC Granulocyte: 3.5 (ref 2–6.9)
POC LYMPH %: 23.4 % (ref 10–50)
POC MID %: 2.9 %M (ref 0–12)
Platelet Count, POC: 284 10*3/uL (ref 142–424)
RBC: 4.82 M/uL (ref 4.04–5.48)
RDW, POC: 13.5 %
WBC: 4.7 10*3/uL (ref 4.6–10.2)

## 2018-06-12 MED ORDER — MECLIZINE HCL 25 MG PO TABS: 25.0000 mg | ORAL_TABLET | Freq: Three times a day (TID) | ORAL | 0 refills | Status: AC | PRN

## 2018-06-12 MED ORDER — AMOXICILLIN-POT CLAVULANATE 875-125 MG PO TABS: 1.0000 | ORAL_TABLET | Freq: Two times a day (BID) | ORAL | 0 refills | Status: AC

## 2018-06-12 NOTE — Patient Instructions (Addendum)
Continue to drink plenty of fluids, saline nasal spray 4-5 times per day. Ok to stop sudafed at this time. Meclizine if needed for dizziness.   If sinus pain/headache is not continuing to improve into next week, then can start antibiotic as we discussed.   Return to the clinic or go to the nearest emergency room if any of your symptoms worsen or new symptoms occur.  Dizziness Dizziness is a common problem. It is a feeling of unsteadiness or light-headedness. You may feel like you are about to faint. Dizziness can lead to injury if you stumble or fall. Anyone can become dizzy, but dizziness is more common in older adults. This condition can be caused by a number of things, including medicines, dehydration, or illness. Follow these instructions at home: Eating and drinking  Drink enough fluid to keep your urine clear or pale yellow. This helps to keep you from becoming dehydrated. Try to drink more clear fluids, such as water.  Do not drink alcohol.  Limit your caffeine intake if told to do so by your health care provider. Check ingredients and nutrition facts to see if a food or beverage contains caffeine.  Limit your salt (sodium) intake if told to do so by your health care provider. Check ingredients and nutrition facts to see if a food or beverage contains sodium. Activity  Avoid making quick movements. ? Rise slowly from chairs and steady yourself until you feel okay. ? In the morning, first sit up on the side of the bed. When you feel okay, stand slowly while you hold onto something until you know that your balance is fine.  If you need to stand in one place for a long time, move your legs often. Tighten and relax the muscles in your legs while you are standing.  Do not drive or use heavy machinery if you feel dizzy.  Avoid bending down if you feel dizzy. Place items in your home so that they are easy for you to reach without leaning over. Lifestyle  Do not use any products that  contain nicotine or tobacco, such as cigarettes and e-cigarettes. If you need help quitting, ask your health care provider.  Try to reduce your stress level by using methods such as yoga or meditation. Talk with your health care provider if you need help to manage your stress. General instructions  Watch your dizziness for any changes.  Take over-the-counter and prescription medicines only as told by your health care provider. Talk with your health care provider if you think that your dizziness is caused by a medicine that you are taking.  Tell a friend or a family member that you are feeling dizzy. If he or she notices any changes in your behavior, have this person call your health care provider.  Keep all follow-up visits as told by your health care provider. This is important. Contact a health care provider if:  Your dizziness does not go away.  Your dizziness or light-headedness gets worse.  You feel nauseous.  You have reduced hearing.  You have new symptoms.  You are unsteady on your feet or you feel like the room is spinning. Get help right away if:  You vomit or have diarrhea and are unable to eat or drink anything.  You have problems talking, walking, swallowing, or using your arms, hands, or legs.  You feel generally weak.  You are not thinking clearly or you have trouble forming sentences. It may take a friend or family member  to notice this.  You have chest pain, abdominal pain, shortness of breath, or sweating.  Your vision changes.  You have any bleeding.  You have a severe headache.  You have neck pain or a stiff neck.  You have a fever. These symptoms may represent a serious problem that is an emergency. Do not wait to see if the symptoms will go away. Get medical help right away. Call your local emergency services (911 in the [place] Do not drive yourself to the hospital. Summary  Dizziness is a feeling of unsteadiness or light-headedness. This  condition can be caused by a number of things, including medicines, dehydration, or illness.  Anyone can become dizzy, but dizziness is more common in older adults.  Drink enough fluid to keep your urine clear or pale yellow. Do not drink alcohol.  Avoid making quick movements if you feel dizzy. Monitor your dizziness for any changes. This information is not intended to replace advice given to you by your health care provider. Make sure you discuss any questions you have with your health care provider. Document Released: 10/03/2000 Document Revised: 05/12/2016 Document Reviewed: 05/12/2016 Elsevier Interactive Patient Education  2019 [employer].  Sinus Headache  A sinus headache occurs when your sinuses become clogged or swollen. Sinuses are air-filled spaces in your skull that are behind the bones of your face and forehead. Sinus headaches can range from mild to severe. What are the causes? A sinus headache can result from various conditions that affect the sinuses. Common causes include:  Colds.  Sinus infections.  Allergies. Many people confuse sinus headaches with migraines or tension headaches because those headaches can also cause facial pain and nasal symptoms. What are the signs or symptoms? The main symptom of this condition is a headache that may feel like pain or pressure in your face, forehead, ears, or upper teeth. People who have a sinus headache often have other symptoms, such as:  Congested or runny nose.  Fever.  Inability to smell. Weather changes can make symptoms worse. How is this diagnosed? This condition may be diagnosed based on:  A physical exam and medical history.  Imaging tests, such as a CT scan or MRI, to check for problems with the sinuses.  Examination of the sinuses using a thin tool with a camera that is inserted through your nose (endoscopy). How is this treated? Treatment for this condition depends on the cause.  Sinus pain that is  caused by a sinus infection may be treated with antibiotic medicine.  Sinus pain that is caused by allergies may be helped by allergy medicines (antihistamines) and medicated nasal sprays.  Sinus pain that is caused by congestion may be helped by rinsing out (flushing) the nose and sinuses with saline solution.  Sinus surgery may be needed in some cases if other treatments do not help. Follow these instructions at home: General instructions  If directed: ? Apply a warm, moist washcloth to your face to help relieve pain. ? Use a nasal saline wash. Medicines   Take over-the-counter and prescription medicines only as told by your health care provider.  If you were prescribed an antibiotic medicine, take it as told by your health care provider. Do not stop taking the antibiotic even if you start to feel better.  If you have congestion, use a nasal spray to help lessen pressure. Hydrate and humidify  Drink enough water to keep your urine clear or pale yellow. Staying hydrated will help to thin your mucus.  Use a cool mist humidifier to keep the humidity level in your home above 50%.  Inhale steam for 10-15 minutes, 3-4 times a day or as told by your health care provider. You can do this in the bathroom while a hot shower is running.  Limit your exposure to cool or dry air. Contact a health care provider if:  You have a headache more than one time a week.  You have sensitivity to light or sound.  You develop a fever.  You feel nauseous or you vomit.  Your headaches do not get better with treatment. Many people think that they have a sinus headache when they actually have a migraine or a tension headache. Get help right away if:  You have vision problems.  You have sudden, severe pain in your face or head.  You have a seizure.  You are confused.  You have a stiff neck. Summary  A sinus headache occurs when your sinuses become clogged or swollen.  A sinus headache can  result from various conditions that affect the sinuses, such as a cold, a sinus infection, or an allergy.  Treatment for this condition depends on the cause. It may include medicine, such as antibiotics or antihistamines. This information is not intended to replace advice given to you by your health care provider. Make sure you discuss any questions you have with your health care provider. Document Released: 05/17/2004 Document Revised: 01/18/2017 Document Reviewed: 01/18/2017 Elsevier Interactive Patient Education  [employer].    If you have lab work done today you will be contacted with your lab results within the next 2 weeks.  If you have not heard from [place] then please contact us. The fastest way to get your results is to register for My Chart.   IF you received an x-ray today, you will receive an invoice from [facility]. Please contact [facility] at [phone] with questions or concerns regarding your invoice.   IF you received labwork today, you will receive an invoice from [facility]. Please contact [facility] at [phone] with questions or concerns regarding your invoice.   Our billing staff will not be able to assist you with questions regarding bills from these companies.  You will be contacted with the lab results as soon as they are available. The fastest way to get your results is to activate your My Chart account. Instructions are located on the last page of this paperwork. If you have not heard from [place] regarding the results in 2 weeks, please contact this office.

## 2018-06-12 NOTE — Progress Notes (Signed)
Subjective:    Patient ID: Kristina Jacobson, female    DOB: [birth year], 63 y.o.   MRN: [id number]  HPI Kristina Jacobson is a 63 y.o. female Presents today for: Chief Complaint  Patient presents with  . Sinusitis    sinus pressure, dizziness x 2 week went to [facility] on 06/06/18 and they gave me sudafed which did not help and stopped taking. still having symptoms  DPO:EUMP, Levell July, MD   Sinus pressure: Seen February 14 at [facility].  Symptoms x5 days, congestion, runny nose.  Recommended Sudafed 12-hour.  Symptomatic care discussed including saline nasal spray, fluids, hot showers, humidifier. Tried sudafed for a few days. Felt better initially, but then felt like made her nauseous. Last taken yesterday at noon.  Sinus pressure/pain. Dizzy/off balance at ties. Some ear popping after using sudafed.  Sinus pressure slight better this am. Dizziness still noted at times - noticed yesterday. Some fatigue. No syncope. No chest pain/palpitatios.  Clear nasal d/c.  No other treatments besides sudafed - no nasal spray.  No tinnitus, no hearing loss.   Orthostatic VS for the past 24 hrs (Last 3 readings):  BP- Lying Pulse- Lying BP- Standing at 0 minutes Pulse- Standing at 0 minutes BP- Standing at 3 minutes Pulse- Standing at 3 minutes  06/12/18 0922 135/86 70 130/89 72 131/89 74      There are no active problems to display for this patient.  No past medical history on file. Past Surgical History:  Procedure Laterality Date  . CESAREAN SECTION     No Known Allergies Prior to Admission medications   Not on File   Social History   Socioeconomic History  . Marital status: Unknown    Spouse name: Not on file  . Number of children: Not on file  . Years of education: Not on file  . Highest education level: Not on file  Occupational History  . Not on file  Social Needs  . Financial resource strain: Not on file  . Food insecurity:    Worry: Not on file    Inability: Not on file  . Transportation needs:    Medical: Not on file    Non-medical: Not on file  Tobacco Use  . Smoking status: Former Games developer  . Smokeless tobacco: Never Used  Substance and Sexual Activity  . Alcohol use: No    Alcohol/week: 0.0 standard drinks  . Drug use: No  . Sexual activity: Not on file  Lifestyle  . Physical activity:    Days per week: Not on file    Minutes per session: Not on file  . Stress: Not on file  Relationships  . Social connections:    Talks on phone: Not on file    Gets together: Not on file    Attends religious service: Not on file    Active member of club or organization: Not on file    Attends meetings of clubs or organizations: Not on file    Relationship status: Not on file  . Intimate partner violence:    Fear of current or ex partner: Not on file    Emotionally abused: Not on file    Physically abused: Not on file    Forced sexual activity: Not on file  Other Topics Concern  . Not on file  Social History Narrative  . Not on file    Review of Systems  HENT: Positive for sinus pressure.   Neurological: Positive  for dizziness.   Per HPI.     Objective:   Physical Exam Vitals signs reviewed.  Constitutional:      General: She is not in acute distress.    Appearance: She is well-developed.  HENT:     Head: Normocephalic and atraumatic.     Right Ear: Hearing, tympanic membrane, ear canal and external ear normal.     Left Ear: Hearing, tympanic membrane, ear canal and external ear normal.     Nose: Nose normal.     Mouth/Throat:     Pharynx: No oropharyngeal exudate.  Eyes:     Conjunctiva/sclera: Conjunctivae normal.     Pupils: Pupils are equal, round, and reactive to light.  Cardiovascular:     Rate and Rhythm: Normal rate and regular rhythm.     Heart sounds: Normal heart sounds. No murmur.  Pulmonary:     Effort: Pulmonary effort is normal. No respiratory distress.     Breath sounds: Normal breath sounds. No  wheezing or rhonchi.  Skin:    General: Skin is warm and dry.     Findings: No rash.  Neurological:     Mental Status: She is alert and oriented to person, place, and time.  Psychiatric:        Behavior: Behavior normal.    Vitals:   06/12/18 0914  BP: 126/84  Pulse: 82  Resp: 14  Temp: 97.9 F (36.6 C)  TempSrc: Oral  SpO2: 100%  Weight: 156 lb 3.2 oz (70.9 kg)  Height: 5\' 3"  (1.6 m)   Results for orders placed or performed in visit on 06/12/18  POCT CBC  Result Value Ref Range   WBC 4.7 4.6 - 10.2 K/uL   Lymph, poc 1.1 0.6 - 3.4   POC LYMPH PERCENT 23.4 10 - 50 %L   MID (cbc) 0.1 0 - 0.9   POC MID % 2.9 0 - 12 %M   POC Granulocyte 3.5 2 - 6.9   Granulocyte percent 73.7 37 - 80 %G   RBC 4.82 4.04 - 5.48 M/uL   Hemoglobin 13.5 11 - 14.6 g/dL   HCT, POC 82.9 29 - 41 %   MCV 82.5 76 - 111 fL   MCH, POC 28.0 27 - 31.2 pg   MCHC 33.9 31.8 - 35.4 g/dL   RDW, POC 56.2 %   Platelet Count, POC 284 142 - 424 K/uL   MPV 7.7 0 - 99.8 fL   Orthostatic VS for the past 24 hrs (Last 3 readings):  BP- Lying Pulse- Lying BP- Standing at 0 minutes Pulse- Standing at 0 minutes BP- Standing at 3 minutes Pulse- Standing at 3 minutes  06/12/18 0922 135/86 70 130/89 72 131/89 74        Assessment & Plan:    Kristina Jacobson is a 63 y.o. female Sinus pressure - Plan: amoxicillin-clavulanate (AUGMENTIN) 875-125 MG tablet Fatigue, unspecified type - Plan: POCT CBC Dizziness - Plan: POCT CBC, meclizine (ANTIVERT) 25 MG tablet  -Suspect sinus symptoms contributing to fatigue and dizziness with middle ear/eustachian tube component possible.  Has not tolerated Sudafed.  Reassuring CBC  -Saline nasal spray discussed, meclizine if needed for vertiginous symptoms.    -Augmentin prescribed if not improving sinus symptoms, potential side effects and risk discussed of antibiotics, rtc precautions.   Special screening for malignant neoplasms, colon - Plan: Ambulatory referral to  Gastroenterology   Meds ordered this encounter  Medications  . amoxicillin-clavulanate (AUGMENTIN) 875-125 MG tablet  Sig: Take 1 tablet by mouth 2 (two) times daily.    Dispense:  20 tablet    Refill:  0  . meclizine (ANTIVERT) 25 MG tablet    Sig: Take 1 tablet (25 mg total) by mouth 3 (three) times daily as needed for dizziness.    Dispense:  30 tablet    Refill:  0   Patient Instructions   Continue to drink plenty of fluids, saline nasal spray 4-5 times per day. Ok to stop sudafed at this time. Meclizine if needed for dizziness.   If sinus pain/headache is not continuing to improve into next week, then can start antibiotic as we discussed.   Return to the clinic or go to the nearest emergency room if any of your symptoms worsen or new symptoms occur.  Dizziness Dizziness is a common problem. It is a feeling of unsteadiness or light-headedness. You may feel like you are about to faint. Dizziness can lead to injury if you stumble or fall. Anyone can become dizzy, but dizziness is more common in older adults. This condition can be caused by a number of things, including medicines, dehydration, or illness. Follow these instructions at home: Eating and drinking  Drink enough fluid to keep your urine clear or pale yellow. This helps to keep you from becoming dehydrated. Try to drink more clear fluids, such as water.  Do not drink alcohol.  Limit your caffeine intake if told to do so by your health care provider. Check ingredients and nutrition facts to see if a food or beverage contains caffeine.  Limit your salt (sodium) intake if told to do so by your health care provider. Check ingredients and nutrition facts to see if a food or beverage contains sodium. Activity  Avoid making quick movements. ? Rise slowly from chairs and steady yourself until you feel okay. ? In the morning, first sit up on the side of the bed. When you feel okay, stand slowly while you hold onto something  until you know that your balance is fine.  If you need to stand in one place for a long time, move your legs often. Tighten and relax the muscles in your legs while you are standing.  Do not drive or use heavy machinery if you feel dizzy.  Avoid bending down if you feel dizzy. Place items in your home so that they are easy for you to reach without leaning over. Lifestyle  Do not use any products that contain nicotine or tobacco, such as cigarettes and e-cigarettes. If you need help quitting, ask your health care provider.  Try to reduce your stress level by using methods such as yoga or meditation. Talk with your health care provider if you need help to manage your stress. General instructions  Watch your dizziness for any changes.  Take over-the-counter and prescription medicines only as told by your health care provider. Talk with your health care provider if you think that your dizziness is caused by a medicine that you are taking.  Tell a friend or a family member that you are feeling dizzy. If he or she notices any changes in your behavior, have this person call your health care provider.  Keep all follow-up visits as told by your health care provider. This is important. Contact a health care provider if:  Your dizziness does not go away.  Your dizziness or light-headedness gets worse.  You feel nauseous.  You have reduced hearing.  You have new symptoms.  You are unsteady  on your feet or you feel like the room is spinning. Get help right away if:  You vomit or have diarrhea and are unable to eat or drink anything.  You have problems talking, walking, swallowing, or using your arms, hands, or legs.  You feel generally weak.  You are not thinking clearly or you have trouble forming sentences. It may take a friend or family member to notice this.  You have chest pain, abdominal pain, shortness of breath, or sweating.  Your vision changes.  You have any  bleeding.  You have a severe headache.  You have neck pain or a stiff neck.  You have a fever. These symptoms may represent a serious problem that is an emergency. Do not wait to see if the symptoms will go away. Get medical help right away. Call your local emergency services (911 in the [place] Do not drive yourself to the hospital. Summary  Dizziness is a feeling of unsteadiness or light-headedness. This condition can be caused by a number of things, including medicines, dehydration, or illness.  Anyone can become dizzy, but dizziness is more common in older adults.  Drink enough fluid to keep your urine clear or pale yellow. Do not drink alcohol.  Avoid making quick movements if you feel dizzy. Monitor your dizziness for any changes. This information is not intended to replace advice given to you by your health care provider. Make sure you discuss any questions you have with your health care provider. Document Released: 10/03/2000 Document Revised: 05/12/2016 Document Reviewed: 05/12/2016 Elsevier Interactive Patient Education  2019 Elsevier Inc.  Sinus Headache  A sinus headache occurs when your sinuses become clogged or swollen. Sinuses are air-filled spaces in your skull that are behind the bones of your face and forehead. Sinus headaches can range from mild to severe. What are the causes? A sinus headache can result from various conditions that affect the sinuses. Common causes include:  Colds.  Sinus infections.  Allergies. Many people confuse sinus headaches with migraines or tension headaches because those headaches can also cause facial pain and nasal symptoms. What are the signs or symptoms? The main symptom of this condition is a headache that may feel like pain or pressure in your face, forehead, ears, or upper teeth. People who have a sinus headache often have other symptoms, such as:  Congested or runny nose.  Fever.  Inability to smell. Weather changes can  make symptoms worse. How is this diagnosed? This condition may be diagnosed based on:  A physical exam and medical history.  Imaging tests, such as a CT scan or MRI, to check for problems with the sinuses.  Examination of the sinuses using a thin tool with a camera that is inserted through your nose (endoscopy). How is this treated? Treatment for this condition depends on the cause.  Sinus pain that is caused by a sinus infection may be treated with antibiotic medicine.  Sinus pain that is caused by allergies may be helped by allergy medicines (antihistamines) and medicated nasal sprays.  Sinus pain that is caused by congestion may be helped by rinsing out (flushing) the nose and sinuses with saline solution.  Sinus surgery may be needed in some cases if other treatments do not help. Follow these instructions at home: General instructions  If directed: ? Apply a warm, moist washcloth to your face to help relieve pain. ? Use a nasal saline wash. Medicines   Take over-the-counter and prescription medicines only as told by your health  care provider.  If you were prescribed an antibiotic medicine, take it as told by your health care provider. Do not stop taking the antibiotic even if you start to feel better.  If you have congestion, use a nasal spray to help lessen pressure. Hydrate and humidify  Drink enough water to keep your urine clear or pale yellow. Staying hydrated will help to thin your mucus.  Use a cool mist humidifier to keep the humidity level in your home above 50%.  Inhale steam for 10-15 minutes, 3-4 times a day or as told by your health care provider. You can do this in the bathroom while a hot shower is running.  Limit your exposure to cool or dry air. Contact a health care provider if:  You have a headache more than one time a week.  You have sensitivity to light or sound.  You develop a fever.  You feel nauseous or you vomit.  Your headaches do not  get better with treatment. Many people think that they have a sinus headache when they actually have a migraine or a tension headache. Get help right away if:  You have vision problems.  You have sudden, severe pain in your face or head.  You have a seizure.  You are confused.  You have a stiff neck. Summary  A sinus headache occurs when your sinuses become clogged or swollen.  A sinus headache can result from various conditions that affect the sinuses, such as a cold, a sinus infection, or an allergy.  Treatment for this condition depends on the cause. It may include medicine, such as antibiotics or antihistamines. This information is not intended to replace advice given to you by your health care provider. Make sure you discuss any questions you have with your health care provider. Document Released: 05/17/2004 Document Revised: 01/18/2017 Document Reviewed: 01/18/2017 Elsevier Interactive Patient Education  [employer]2019 Elsevier Inc.    If you have lab work done today you will be contacted with your lab results within the next 2 weeks.  If you have not heard from us then please contact us. The fastest way to get your results is to register for My Chart.   IF you received an x-ray today, you will receive an invoice from [facility]. Please contact [facility] at [phone] with questions or concerns regarding your invoice.   IF you received labwork today, you will receive an invoice from [facility]. Please contact [facility] at [phone] with questions or concerns regarding your invoice.   Our billing staff will not be able to assist you with questions regarding bills from these companies.  You will be contacted with the lab results as soon as they are available. The fastest way to get your results is to activate your My Chart account. Instructions are located on the last page of this paperwork. If you have not heard from us regarding the results in 2 weeks, please  contact this office.       Signed,   Meredith StaggersJeffrey Sallee Hogrefe, MD [facility].  06/13/18 8:10 PM

## 2018-07-18 ENCOUNTER — Encounter: Payer: Self-pay | Admitting: Family Medicine

## 2019-01-12 ENCOUNTER — Ambulatory Visit: Payer: BLUE CROSS/BLUE SHIELD | Admitting: Physician Assistant

## 2019-01-26 ENCOUNTER — Ambulatory Visit: Payer: BLUE CROSS/BLUE SHIELD | Admitting: Physician Assistant

## 2019-04-23 IMAGING — DX DG CHEST 2V
2 series · 2 of 2 positions shown · non-contrast
Comparison: None.

CLINICAL DATA: Skin rash

EXAM:
CHEST - 2 VIEW

[chest pa]
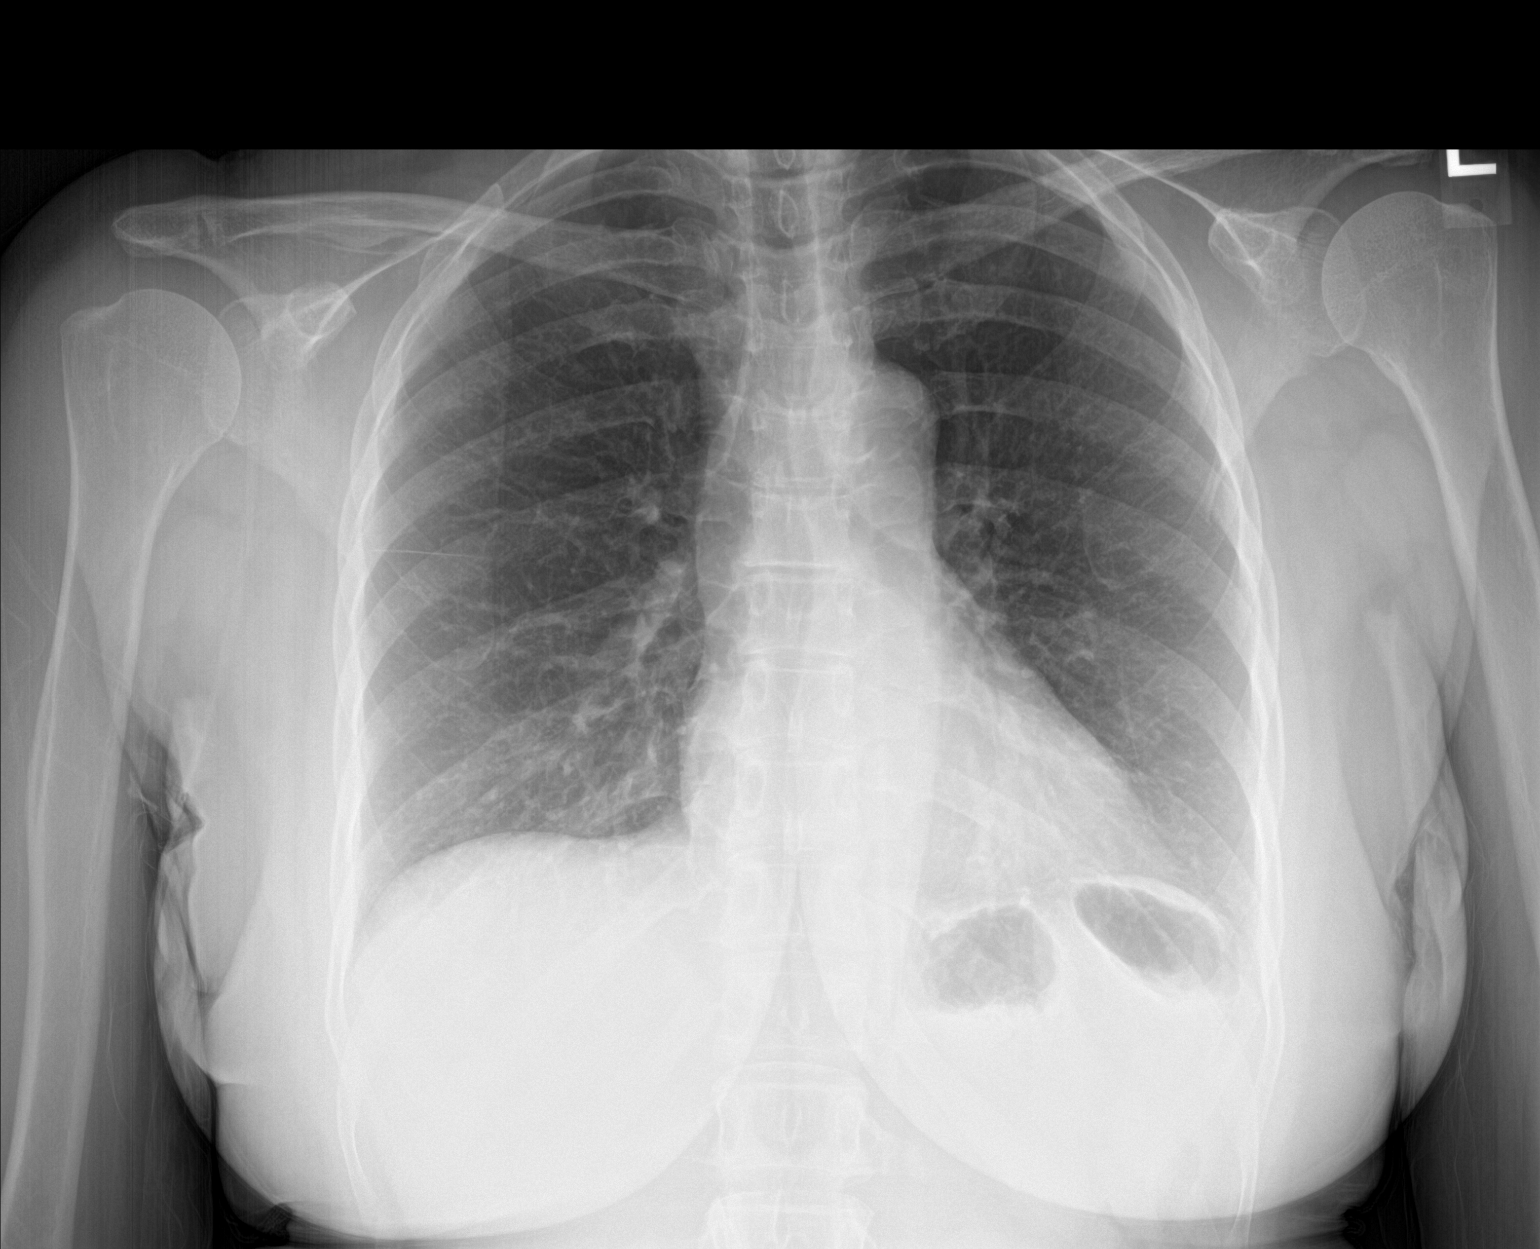

[chest lat]
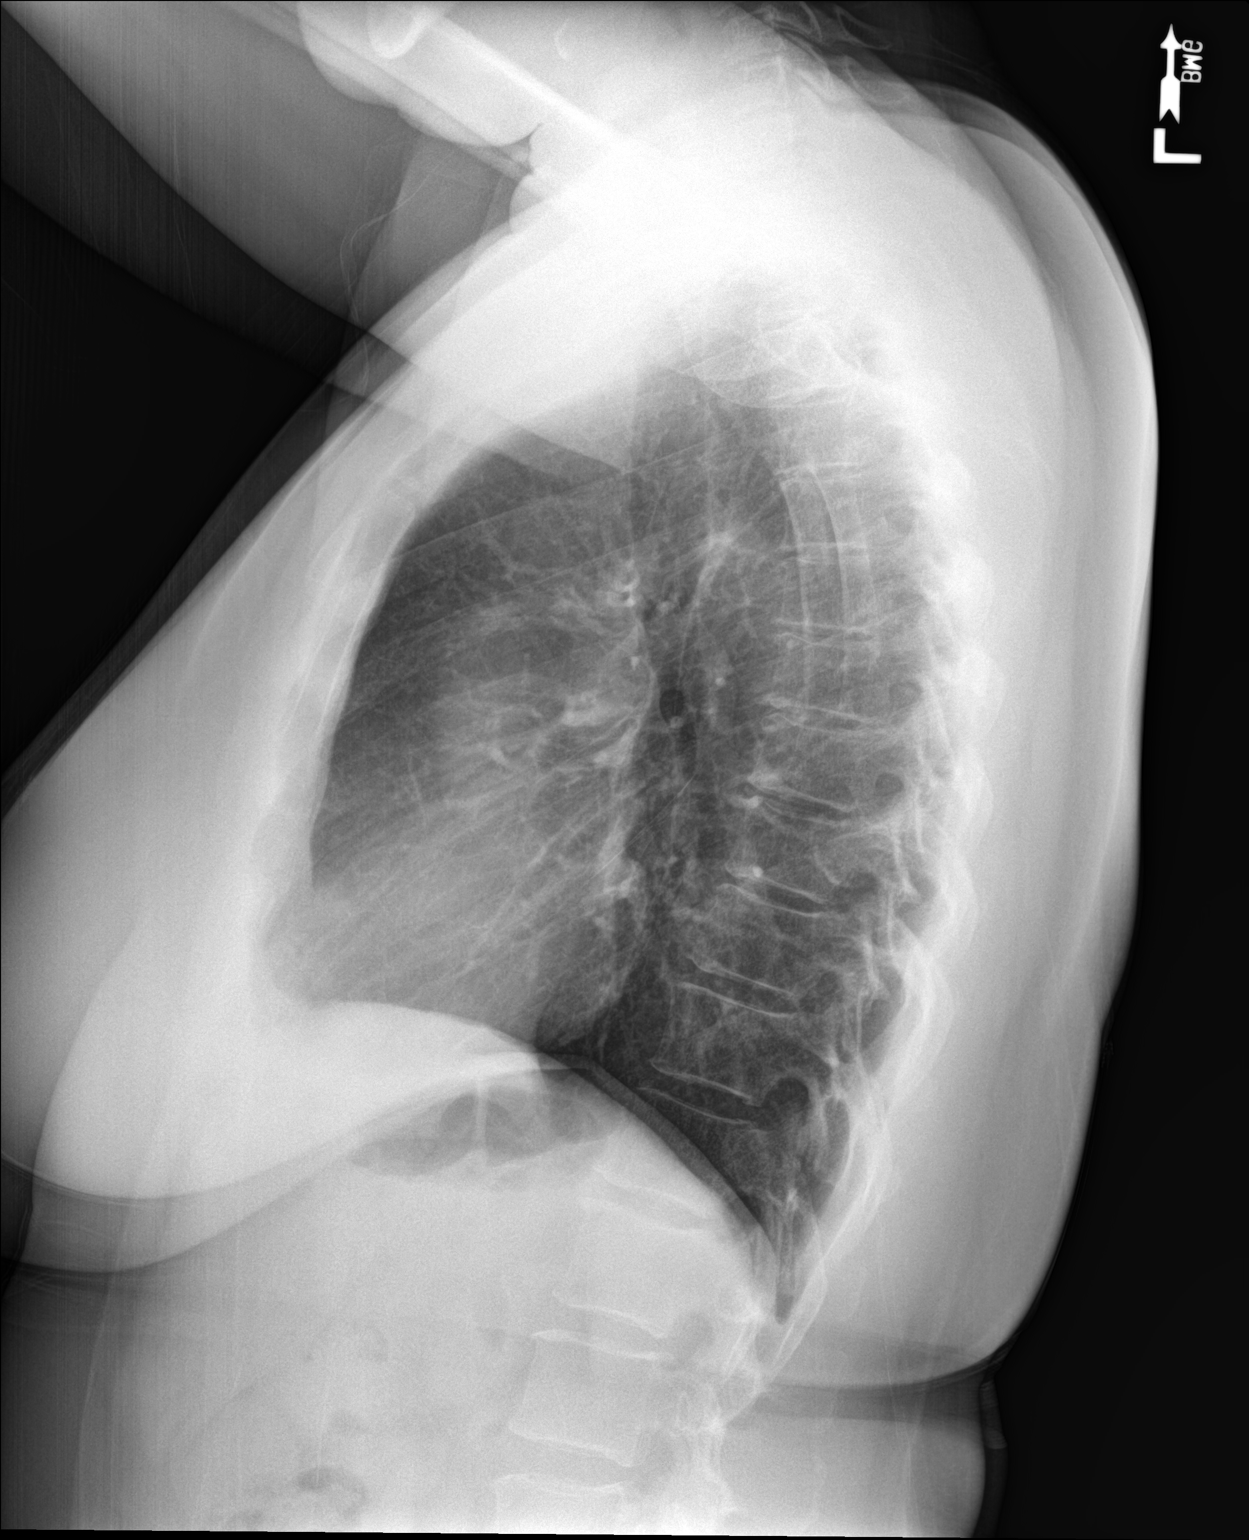

[2 of 2 positions shown; findings below may reference images not displayed]

FINDINGS: The heart size and mediastinal contours are within normal limits.
Both lungs are clear. The visualized skeletal structures are
unremarkable.
IMPRESSION: No active cardiopulmonary disease.
